# Patient Record
Sex: Male | Born: 1960 | Race: White | Hispanic: No | Marital: Married | Smoking: Current every day smoker
Health system: Southern US, Community
[De-identification: ages and names within clinical notes are randomized; demographics above are authoritative.]

## PROBLEM LIST (undated history)

## (undated) DIAGNOSIS — M79673 Pain in unspecified foot: Secondary | ICD-10-CM

## (undated) DIAGNOSIS — R202 Paresthesia of skin: Secondary | ICD-10-CM

## (undated) DIAGNOSIS — M1990 Unspecified osteoarthritis, unspecified site: Secondary | ICD-10-CM

## (undated) DIAGNOSIS — G5600 Carpal tunnel syndrome, unspecified upper limb: Secondary | ICD-10-CM

## (undated) DIAGNOSIS — R200 Anesthesia of skin: Secondary | ICD-10-CM

## (undated) DIAGNOSIS — E871 Hypo-osmolality and hyponatremia: Secondary | ICD-10-CM

## (undated) DIAGNOSIS — R55 Syncope and collapse: Secondary | ICD-10-CM

## (undated) DIAGNOSIS — D169 Benign neoplasm of bone and articular cartilage, unspecified: Secondary | ICD-10-CM

## (undated) DIAGNOSIS — J069 Acute upper respiratory infection, unspecified: Secondary | ICD-10-CM

## (undated) DIAGNOSIS — I10 Essential (primary) hypertension: Secondary | ICD-10-CM

## (undated) DIAGNOSIS — A4902 Methicillin resistant Staphylococcus aureus infection, unspecified site: Secondary | ICD-10-CM

## (undated) DIAGNOSIS — K644 Residual hemorrhoidal skin tags: Secondary | ICD-10-CM

## (undated) DIAGNOSIS — I4891 Unspecified atrial fibrillation: Secondary | ICD-10-CM

## (undated) HISTORY — DX: Hypo-osmolality and hyponatremia: E87.1

## (undated) HISTORY — DX: Unspecified atrial fibrillation: I48.91

## (undated) HISTORY — DX: Essential (primary) hypertension: I10

## (undated) HISTORY — DX: Syncope and collapse: R55

## (undated) HISTORY — DX: Carpal tunnel syndrome, unspecified upper limb: G56.00

## (undated) HISTORY — PX: ANKLE SURGERY: SHX546

## (undated) HISTORY — DX: Paresthesia of skin: R20.0

## (undated) HISTORY — DX: Benign neoplasm of bone and articular cartilage, unspecified: D16.9

## (undated) HISTORY — PX: KNEE SURGERY: SHX244

## (undated) HISTORY — DX: Acute upper respiratory infection, unspecified: J06.9

## (undated) HISTORY — DX: Residual hemorrhoidal skin tags: K64.4

## (undated) HISTORY — DX: Anesthesia of skin: R20.2

---

## 2017-03-12 DIAGNOSIS — M7671 Peroneal tendinitis, right leg: Secondary | ICD-10-CM | POA: Insufficient documentation

## 2017-03-12 HISTORY — DX: Peroneal tendinitis, right leg: M76.71

## 2018-05-23 DIAGNOSIS — M21621 Bunionette of right foot: Secondary | ICD-10-CM | POA: Insufficient documentation

## 2018-05-23 HISTORY — DX: Bunionette of right foot: M21.621

## 2018-08-13 ENCOUNTER — Encounter: Payer: Self-pay | Admitting: Cardiology

## 2018-08-13 DIAGNOSIS — J069 Acute upper respiratory infection, unspecified: Secondary | ICD-10-CM

## 2018-08-13 DIAGNOSIS — I10 Essential (primary) hypertension: Secondary | ICD-10-CM

## 2018-08-13 DIAGNOSIS — R55 Syncope and collapse: Secondary | ICD-10-CM

## 2018-08-13 HISTORY — DX: Essential (primary) hypertension: I10

## 2018-08-13 HISTORY — DX: Syncope and collapse: R55

## 2018-08-13 HISTORY — DX: Acute upper respiratory infection, unspecified: J06.9

## 2018-08-19 ENCOUNTER — Ambulatory Visit (INDEPENDENT_AMBULATORY_CARE_PROVIDER_SITE_OTHER): Payer: 59 | Admitting: Cardiology

## 2018-08-19 ENCOUNTER — Encounter: Payer: Self-pay | Admitting: Cardiology

## 2018-08-19 DIAGNOSIS — F17210 Nicotine dependence, cigarettes, uncomplicated: Secondary | ICD-10-CM | POA: Diagnosis not present

## 2018-08-19 DIAGNOSIS — I10 Essential (primary) hypertension: Secondary | ICD-10-CM | POA: Diagnosis not present

## 2018-08-19 DIAGNOSIS — R55 Syncope and collapse: Secondary | ICD-10-CM

## 2018-08-19 DIAGNOSIS — R0609 Other forms of dyspnea: Secondary | ICD-10-CM | POA: Insufficient documentation

## 2018-08-19 DIAGNOSIS — J069 Acute upper respiratory infection, unspecified: Secondary | ICD-10-CM | POA: Diagnosis not present

## 2018-08-19 DIAGNOSIS — R0600 Dyspnea, unspecified: Secondary | ICD-10-CM

## 2018-08-19 HISTORY — DX: Other forms of dyspnea: R06.09

## 2018-08-19 HISTORY — DX: Dyspnea, unspecified: R06.00

## 2018-08-19 HISTORY — DX: Nicotine dependence, cigarettes, uncomplicated: F17.210

## 2018-08-19 NOTE — Progress Notes (Signed)
Cardiology Office Note:    Date:  08/19/2018   ID:  Jermaine Rose, DOB 09-Aug-1961, MRN [id number]  PCP:  Darryll Capers, NP  Cardiologist:  Jenean Lindau, MD   Referring MD: Darryll Capers, NP    ASSESSMENT:    1. Syncope, unspecified syncope type   2. Viral upper respiratory tract infection   3. Essential hypertension   4. Cigarette smoker    PLAN:    In order of problems listed above:  1. Primary prevention stressed with the patient.  Importance of compliance with diet and medication stressed and he vocalized understanding.  His blood pressure is stable.  I reviewed lipids from the records provided to me and they were unremarkable. 2. His syncopal event clearly appears to be a vagal event. 3. I spent 5 minutes with the patient discussing solely about smoking. Smoking cessation was counseled. I suggested to the patient also different medications and pharmacological interventions. Patient is keen to try stopping on its own at this time. He will get back to me if he needs any further assistance in this matter. 4. In view of multiple risk factors for coronary artery disease and dyspnea on exertion and sedentary lifestyle he will undergo exercise stress echo. 5. Patient will be seen in follow-up appointment in 6 months or earlier if the patient has any concerns    Medication Adjustments/Labs and Tests Ordered: Current medicines are reviewed at length with the patient today.  Concerns regarding medicines are outlined above.  No orders of the defined types were placed in this encounter.  No orders of the defined types were placed in this encounter.    History of Present Illness:    Jermaine Rose is a 57 y.o. male who is being seen today for the evaluation of syncope at the request of Rhyne, Nyoka Cowden, NP.  Patient is a pleasant 57 year old male.  He has past medical history of essential hypertension.  He leads a sedentary lifestyle because of orthopedic issues relating to his right  foot.  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  He mentions to me that he got bad news suddenly and then he passed out.  He is never had any chest pain orthopnea or PND.  He has some shortness of breath on exertion.  He is unfortunately his longtime cigarette smoker.  No palpitations or any such symptoms.  He has never had such an event in the past.  At the time of my evaluation, the patient is alert awake oriented and in no distress.  Past Medical History:  Diagnosis Date  . External hemorrhoids   . Hypertension 08/13/2018  . Syncope 08/13/2018  . URI (upper respiratory infection) 08/13/2018    Past Surgical History:  Procedure Laterality Date  . KNEE SURGERY      Current Medications: Current Meds  Medication Sig  . amLODipine (NORVASC) 10 MG tablet Take 10 mg by mouth daily.  Marland Kitchen Dextromethorphan-guaiFENesin (MUCINEX DM MAXIMUM STRENGTH) 60-1200 MG TB12 Take 1 tablet by mouth every 12 (twelve) hours.  . psyllium (METAMUCIL) 58.6 % packet Take 1 packet by mouth daily.     Allergies:   Lisinopril   Social History   Socioeconomic History  . Marital status: Unknown    Spouse name: Not on file  . Number of children: Not on file  . Years of education: Not on file  . Highest education level: Not on file  Occupational History  . Not on file  Social Needs  .  Financial resource strain: Not on file  . Food insecurity:    Worry: Not on file    Inability: Not on file  . Transportation needs:    Medical: Not on file    Non-medical: Not on file  Tobacco Use  . Smoking status: Current Every Day Smoker  . Smokeless tobacco: Never Used  Substance and Sexual Activity  . Alcohol use: Not on file  . Drug use: Not on file  . Sexual activity: Not on file  Lifestyle  . Physical activity:    Days per week: Not on file    Minutes per session: Not on file  . Stress: Not on file  Relationships  . Social connections:    Talks on phone: Not on file    Gets together: Not on file    Attends  religious service: Not on file    Active member of club or organization: Not on file    Attends meetings of clubs or organizations: Not on file    Relationship status: Not on file  Other Topics Concern  . Not on file  Social History Narrative  . Not on file     Family History: The patient's family history includes Breast cancer in his mother.  ROS:   Please see the history of present illness.    All other systems reviewed and are negative.  EKGs/Labs/Other Studies Reviewed:    The following studies were reviewed today: EKG reveals sinus rhythm and nonspecific ST changes.   Recent Labs: No results found for requested labs within last 8760 hours.  Recent Lipid Panel No results found for: CHOL, TRIG, HDL, CHOLHDL, VLDL, LDLCALC, LDLDIRECT  Physical Exam:    VS:  BP (!) 124/58 (BP Location: Right Arm, Patient Position: Sitting, Cuff Size: Normal)   Pulse 86   Ht 6\' 1"  (1.854 m)   Wt 194 lb (88 kg)   SpO2 98%   BMI 25.60 kg/m     Wt Readings from Last 3 Encounters:  08/19/18 194 lb (88 kg)     GEN: Patient is in no acute distress HEENT: Normal NECK: No JVD; No carotid bruits LYMPHATICS: No lymphadenopathy CARDIAC: S1 S2 regular, 2/6 systolic murmur at the apex. RESPIRATORY:  Clear to auscultation without rales, wheezing or rhonchi  ABDOMEN: Soft, non-tender, non-distended MUSCULOSKELETAL:  No edema; No deformity  SKIN: Warm and dry NEUROLOGIC:  Alert and oriented x 3 PSYCHIATRIC:  Normal affect    Signed, Jenean Lindau, MD  08/19/2018 4:06 PM    [facility]

## 2018-08-19 NOTE — Patient Instructions (Signed)
Medication Instructions:  Your physician recommends that you continue on your current medications as directed. Please refer to the Current Medication list given to you today.  If you need a refill on your cardiac medications before your next appointment, please call your pharmacy.   Lab work: None If you have labs (blood work) drawn today and your tests are completely normal, you will receive your results only by: Marland Kitchen MyChart Message (if you have MyChart) OR . A paper copy in the mail If you have any lab test that is abnormal or we need to change your treatment, we will call you to review the results.  Testing/Procedures: Your physician has requested that you have a stress echocardiogram. For further information please visit [email or web address]. Please follow instruction sheet as given.  Follow-Up: At [facility], you and your health needs are our priority.  As part of our continuing mission to provide you with exceptional heart care, we have created designated Provider Care Teams.  These Care Teams include your primary Cardiologist (physician) and Advanced Practice Providers (APPs -  Physician Assistants and Nurse Practitioners) who all work together to provide you with the care you need, when you need it.  You will need a follow up appointment in 6 months.  Please call our office 2 months in advance to schedule this appointment.  You may see another member of our [employer] Provider Team in [place]: Jenne Campus, MD . Shirlee More, MD  Any Other Special Instructions Will Be Listed Below (If Applicable).

## 2018-08-19 NOTE — Addendum Note (Signed)
Addended by: Mattie Marlin on: 08/19/2018 04:12 PM   Modules accepted: Orders

## 2018-08-25 ENCOUNTER — Ambulatory Visit (INDEPENDENT_AMBULATORY_CARE_PROVIDER_SITE_OTHER): Payer: 59

## 2018-08-25 DIAGNOSIS — R55 Syncope and collapse: Secondary | ICD-10-CM | POA: Diagnosis not present

## 2018-08-25 DIAGNOSIS — R0609 Other forms of dyspnea: Secondary | ICD-10-CM | POA: Diagnosis not present

## 2018-08-25 NOTE — Progress Notes (Signed)
Stress echocardiogram with limited exam has been performed.  Jimmy Hashir Deleeuw RDCS, RVT

## 2019-12-30 ENCOUNTER — Encounter: Payer: Self-pay | Admitting: *Deleted

## 2020-01-04 ENCOUNTER — Ambulatory Visit: Payer: 59 | Admitting: Diagnostic Neuroimaging

## 2020-01-04 ENCOUNTER — Encounter: Payer: Self-pay | Admitting: Diagnostic Neuroimaging

## 2020-01-04 ENCOUNTER — Other Ambulatory Visit: Payer: Self-pay

## 2020-01-04 VITALS — BP 158/89 | HR 101 | Temp 97.5°F | Ht 73.0 in | Wt 190.0 lb

## 2020-01-04 DIAGNOSIS — R200 Anesthesia of skin: Secondary | ICD-10-CM

## 2020-01-04 NOTE — Patient Instructions (Signed)
Bilateral hand numbness (suspect bilateral carpal tunnel syndrome) - check NCV/EMG (nerve testing) - try wrist splints at bedtime

## 2020-01-04 NOTE — Progress Notes (Signed)
[facility]  PATIENT: Jermaine Rose DOB: [birth year]  REFERRING CLINICIAN: Elenore Paddy, NP HISTORY FROM: patient  REASON FOR VISIT: new consult    HISTORICAL  CHIEF COMPLAINT:  Chief Complaint  Patient presents with  . R Hand Paresthesia    rm 7 New Pt "numbness, tingling both arms from elbow down, very painful, wakes me at night for past year; PCP checked labs- all okay"    HISTORY OF PRESENT ILLNESS:   59 year old male with bilateral hand numbness for past 1 year.  Symptoms mainly affect him at nighttime.  He has had interrupted sleep because of this problem.  He feels numbness and pain in his hands sometimes from his elbows down to his fingers.  Fifth digit is less affected than others.  No problems in legs or feet.  No problems in face vision speech or swallowing.  No neck pain.  Lab testing by PCP has been unremarkable.   REVIEW OF SYSTEMS: Full 14 system review of systems performed and negative with exception of: As per HPI.  ALLERGIES: Allergies  Allergen Reactions  . Lisinopril     Ace inhibitors- Swelling; Angio-Edema    HOME MEDICATIONS: Outpatient Medications Prior to Visit  Medication Sig Dispense Refill  . amLODipine (NORVASC) 10 MG tablet Take 10 mg by mouth daily.    . hydrochlorothiazide (HYDRODIURIL) 12.5 MG tablet Take 12.5 mg by mouth every morning.    . psyllium (METAMUCIL) 58.6 % packet Take 1 packet by mouth daily.    Marland Kitchen Dextromethorphan-guaiFENesin (MUCINEX DM MAXIMUM STRENGTH) 60-1200 MG TB12 Take 1 tablet by mouth every 12 (twelve) hours.     No facility-administered medications prior to visit.    PAST MEDICAL HISTORY: Past Medical History:  Diagnosis Date  . External hemorrhoids   . Hypertension 08/13/2018  . Numbness and tingling in both hands   . Syncope 08/13/2018  . URI (upper respiratory infection) 08/13/2018    PAST SURGICAL HISTORY: Past Surgical History:  Procedure Laterality Date  . ANKLE SURGERY Right  2008,2009  . KNEE SURGERY Bilateral     FAMILY HISTORY: Family History  Problem Relation Age of Onset  . Breast cancer Mother   . Atrial fibrillation Mother   . Atrial fibrillation Father     SOCIAL HISTORY: Social History   Socioeconomic History  . Marital status: Married    Spouse name: Not on file  . Number of children: 1  . Years of education: Not on file  . Highest education level: High school graduate  Occupational History    Comment: [employer]  Tobacco Use  . Smoking status: Current Every Day Smoker    Packs/day: 0.75    Types: Cigarettes  . Smokeless tobacco: Never Used  Substance and Sexual Activity  . Alcohol use: Yes    Alcohol/week: 14.0 standard drinks    Types: 14 Cans of beer per week  . Drug use: Not Currently  . Sexual activity: Not on file  Other Topics Concern  . Not on file  Social History Narrative   Lives with wife   caffeine use heavy   Social Determinants of Health   Financial Resource Strain:   . Difficulty of Paying Living Expenses:   Food Insecurity:   . Worried About Charity fundraiser in the Last Year:   . Arboriculturist in the Last Year:   Transportation Needs:   . Film/video editor (Medical):   Marland Kitchen Lack of Transportation (Non-Medical):   Physical Activity:   .  Days of Exercise per Week:   . Minutes of Exercise per Session:   Stress:   . Feeling of Stress :   Social Connections:   . Frequency of Communication with Friends and Family:   . Frequency of Social Gatherings with Friends and Family:   . Attends Religious Services:   . Active Member of Clubs or Organizations:   . Attends Archivist Meetings:   Marland Kitchen Marital Status:   Intimate Partner Violence:   . Fear of Current or Ex-Partner:   . Emotionally Abused:   Marland Kitchen Physically Abused:   . Sexually Abused:      PHYSICAL EXAM  GENERAL EXAM/CONSTITUTIONAL: Vitals:  Vitals:   01/04/20 0806  BP: (!) 158/89  Pulse: (!) 101  Temp: (!) 97.5 F (36.4 C)  Weight:  190 lb 0.6 oz (86.2 kg)  Height: 6\' 1"  (1.854 m)     Body mass index is 25.07 kg/m. Wt Readings from Last 3 Encounters:  01/04/20 190 lb 0.6 oz (86.2 kg)  08/19/18 194 lb (88 kg)     Patient is in no distress; well developed, nourished and groomed; neck is supple  CARDIOVASCULAR:  Examination of carotid arteries is normal; no carotid bruits  Regular rate and rhythm, no murmurs  Examination of peripheral vascular system by observation and palpation is normal  EYES:  Ophthalmoscopic exam of optic discs and posterior segments is normal; no papilledema or hemorrhages  No exam data present  MUSCULOSKELETAL:  Gait, strength, tone, movements noted in Neurologic exam below  NEUROLOGIC: MENTAL STATUS:  No flowsheet data found.  awake, alert, oriented to person, place and time  recent and remote memory intact  normal attention and concentration  language fluent, comprehension intact, naming intact  fund of knowledge appropriate  CRANIAL NERVE:   2nd - no papilledema on fundoscopic exam  2nd, 3rd, 4th, 6th - pupils equal and reactive to light, visual fields full to confrontation, extraocular muscles intact, no nystagmus  5th - facial sensation symmetric  7th - facial strength symmetric  8th - hearing intact  9th - palate elevates symmetrically, uvula midline  11th - shoulder shrug symmetric  12th - tongue protrusion midline  MOTOR:   normal bulk and tone, full strength in the BUE, BLE; EXCEPT ATROPHY OF RIGHT > LEFT APB  SENSORY:   normal and symmetric to light touch, pinprick, temperature, vibration; EXCEPT DECR IN FINGERTIPS  NEGATIVE TINELS  BORDERLINE PHALENS  COORDINATION:   finger-nose-finger, fine finger movements normal  REFLEXES:   deep tendon reflexes present and symmetric  GAIT/STATION:   narrow based gait     DIAGNOSTIC DATA (LABS, IMAGING, TESTING) - I reviewed patient records, labs, notes, testing and imaging myself where  available.  No results found for: WBC, HGB, HCT, MCV, PLT No results found for: NA, K, CL, CO2, GLUCOSE, BUN, CREATININE, CALCIUM, PROT, ALBUMIN, AST, ALT, ALKPHOS, BILITOT, GFRNONAA, GFRAA No results found for: CHOL, HDL, LDLCALC, LDLDIRECT, TRIG, CHOLHDL No results found for: HGBA1C No results found for: VITAMINB12 No results found for: TSH   A1c, B12 - normal    ASSESSMENT AND PLAN  59 y.o. year old male here with bilateral hand numbness since 2020 with signs and symptoms most consistent with bilateral carpal tunnel syndrome.  We will proceed with further work-up.  Dx:  1. Bilateral hand numbness      PLAN:  Bilateral hand numbness (suspect bilateral CTS) - check NCV/EMG - try wrist splints at bedtime  Orders Placed This Encounter  Procedures  . NCV with EMG(electromyography)   Return for for NCV/EMG.    Penni Bombard, MD [id number], [id number] AM Certified in Neurology, Neurophysiology and Neuroimaging  [facility] [address] [place], [place] [address] [phone]

## 2020-02-04 ENCOUNTER — Encounter: Payer: 59 | Admitting: Diagnostic Neuroimaging

## 2020-02-04 ENCOUNTER — Ambulatory Visit (INDEPENDENT_AMBULATORY_CARE_PROVIDER_SITE_OTHER): Payer: 59 | Admitting: Diagnostic Neuroimaging

## 2020-02-04 ENCOUNTER — Other Ambulatory Visit: Payer: Self-pay

## 2020-02-04 DIAGNOSIS — R200 Anesthesia of skin: Secondary | ICD-10-CM | POA: Diagnosis not present

## 2020-02-04 DIAGNOSIS — Z0289 Encounter for other administrative examinations: Secondary | ICD-10-CM

## 2020-02-04 NOTE — Procedures (Signed)
[facility]  NCS (NERVE CONDUCTION STUDY) WITH EMG (ELECTROMYOGRAPHY) REPORT   STUDY DATE: 02/04/20 PATIENT NAME: Jermaine Rose DOB: 1961/01/09 MRN: [id number]  ORDERING CLINICIAN: Andrey Spearman, MD   TECHNOLOGIST: Sherre Scarlet ELECTROMYOGRAPHER: Earlean Polka. Lizania Bouchard, MD  CLINICAL INFORMATION: 59 year old male with bilateral hand numbness.  FINDINGS: NERVE CONDUCTION STUDY:  Right median motor response has prolonged distal latency (6.8 ms) decreased amplitude and normal conduction velocity.  Left median motor response has prolonged distal latency (7.2 ms) normal amplitude, slow conduction velocity.  Bilateral ulnar motor responses are normal.  Bilateral ulnar F-wave latencies are slightly prolonged.  Bilateral median sensory responses could not be obtained.  Bilateral ulnar sensory responses have slightly prolonged peak latencies and normal amplitudes.  Bilateral radial sensory responses are normal.    NEEDLE ELECTROMYOGRAPHY:  Needle examination of left upper extremity deltoid, biceps, triceps, flexor carpi radialis, first dorsal interosseous is normal.    IMPRESSION:   Abnormal study demonstrating: - Moderate bilateral median neuropathies at the wrist consistent with moderate bilateral carpal tunnel syndrome (right worse than left).  - Borderline, mild bilateral ulnar sensory neuropathies, not further localized.    INTERPRETING PHYSICIAN:  Penni Bombard, MD Certified in Neurology, Neurophysiology and Neuroimaging  [facility] [address] [place], [place] [address] [phone]  [facility]    Nerve / Sites Muscle Latency Ref. Amplitude Ref. Rel Amp Segments Distance Velocity Ref. Area    ms ms mV mV %  cm m/s m/s mVms  R Median - APB     Wrist APB 6.8 ?4.4 0.1 ?4.0 100 Wrist - APB 7   0.6     Upper arm APB 11.0  0.1  54.3 Upper arm - Wrist 23 55 ?49 1.0  L Median - APB     Wrist APB 7.2 ?4.4 5.7 ?4.0 100  Wrist - APB 7   21.3     Upper arm APB 13.2  5.3  92.7 Upper arm - Wrist 23 38 ?49 19.5  R Ulnar - ADM     Wrist ADM 2.8 ?3.3 11.5 ?6.0 100 Wrist - ADM 7   34.3     B.Elbow ADM 7.3  11.2  97.3 B.Elbow - Wrist 24 53 ?49 31.8     A.Elbow ADM 9.3  11.1  98.6 A.Elbow - B.Elbow 10 50 ?49 31.5         A.Elbow - Wrist      L Ulnar - ADM     Wrist ADM 2.8 ?3.3 11.5 ?6.0 100 Wrist - ADM 7   33.4     B.Elbow ADM 7.1  10.0  86.8 B.Elbow - Wrist 23 53 ?49 30.4     A.Elbow ADM 9.1  9.7  97.4 A.Elbow - B.Elbow 10 49 ?49 30.3         A.Elbow - Wrist                 SNC    Nerve / Sites Rec. Site Peak Lat Ref.  Amp Ref. Segments Distance    ms ms V V  cm  R Radial - Anatomical snuff box (Forearm)     Forearm Wrist 2.5 ?2.9 18 ?15 Forearm - Wrist 10  L Radial - Anatomical snuff box (Forearm)     Forearm Wrist 2.6 ?2.9 16 ?15 Forearm - Wrist 10  R Median - Orthodromic (Dig II, Mid palm)     Dig II Wrist NR ?3.4 NR ?10 Dig II -  Wrist 13  L Median - Orthodromic (Dig II, Mid palm)     Dig II Wrist NR ?3.4 NR ?10 Dig II - Wrist 13  R Ulnar - Orthodromic, (Dig V, Mid palm)     Dig V Wrist 3.2 ?3.1 6 ?5 Dig V - Wrist 11  L Ulnar - Orthodromic, (Dig V, Mid palm)     Dig V Wrist 3.4 ?3.1 6 ?5 Dig V - Wrist 8                 F  Wave    Nerve F Lat Ref.   ms ms  R Ulnar - ADM 34.0 ?32.0  L Ulnar - ADM 35.1 ?32.0         EMG Summary Table    Spontaneous MUAP Recruitment  Muscle IA Fib PSW Fasc Other Amp Dur. Poly Pattern  L. Deltoid Normal None None None _______ Normal Normal Normal Normal  L. Biceps brachii Normal None None None _______ Normal Normal Normal Normal  L. Triceps brachii Normal None None None _______ Normal Normal Normal Normal  L. Flexor carpi radialis Normal None None None _______ Normal Normal Normal Normal  L. First dorsal interosseous Normal None None None _______ Normal Normal Normal Normal

## 2020-02-15 DIAGNOSIS — M180 Bilateral primary osteoarthritis of first carpometacarpal joints: Secondary | ICD-10-CM

## 2020-02-15 HISTORY — DX: Bilateral primary osteoarthritis of first carpometacarpal joints: M18.0

## 2020-02-18 ENCOUNTER — Other Ambulatory Visit: Payer: Self-pay | Admitting: Orthopedic Surgery

## 2020-04-25 ENCOUNTER — Other Ambulatory Visit: Payer: Self-pay

## 2020-04-25 ENCOUNTER — Encounter (HOSPITAL_BASED_OUTPATIENT_CLINIC_OR_DEPARTMENT_OTHER): Payer: Self-pay | Admitting: Orthopedic Surgery

## 2020-04-28 ENCOUNTER — Other Ambulatory Visit (HOSPITAL_COMMUNITY): Payer: 59 | Attending: Orthopedic Surgery

## 2020-04-30 ENCOUNTER — Other Ambulatory Visit (HOSPITAL_COMMUNITY)
Admission: RE | Admit: 2020-04-30 | Discharge: 2020-04-30 | Disposition: A | Discharge status: Home / Self Care | Payer: 59 | Source: Ambulatory Visit | Attending: Orthopedic Surgery | Admitting: Orthopedic Surgery

## 2020-04-30 DIAGNOSIS — Z20822 Contact with and (suspected) exposure to covid-19: Secondary | ICD-10-CM | POA: Insufficient documentation

## 2020-04-30 LAB — SARS CORONAVIRUS 2 (TAT 6-24 HRS): SARS Coronavirus 2: NEGATIVE

## 2020-05-02 ENCOUNTER — Ambulatory Visit (HOSPITAL_BASED_OUTPATIENT_CLINIC_OR_DEPARTMENT_OTHER): Payer: 59 | Admitting: Anesthesiology

## 2020-05-02 ENCOUNTER — Encounter (HOSPITAL_BASED_OUTPATIENT_CLINIC_OR_DEPARTMENT_OTHER): Payer: Self-pay | Admitting: Orthopedic Surgery

## 2020-05-02 ENCOUNTER — Ambulatory Visit (HOSPITAL_BASED_OUTPATIENT_CLINIC_OR_DEPARTMENT_OTHER)
Admission: RE | Admit: 2020-05-02 | Discharge: 2020-05-02 | Disposition: A | Discharge status: Home / Self Care | Payer: 59 | Attending: Orthopedic Surgery | Admitting: Orthopedic Surgery

## 2020-05-02 ENCOUNTER — Encounter (HOSPITAL_BASED_OUTPATIENT_CLINIC_OR_DEPARTMENT_OTHER)
Admission: RE | Disposition: A | Discharge status: Home / Self Care | Payer: Self-pay | Source: Home / Self Care | Attending: Orthopedic Surgery

## 2020-05-02 DIAGNOSIS — Z888 Allergy status to other drugs, medicaments and biological substances status: Secondary | ICD-10-CM | POA: Diagnosis not present

## 2020-05-02 DIAGNOSIS — G5601 Carpal tunnel syndrome, right upper limb: Secondary | ICD-10-CM | POA: Diagnosis not present

## 2020-05-02 DIAGNOSIS — I10 Essential (primary) hypertension: Secondary | ICD-10-CM | POA: Diagnosis not present

## 2020-05-02 DIAGNOSIS — M1990 Unspecified osteoarthritis, unspecified site: Secondary | ICD-10-CM | POA: Diagnosis not present

## 2020-05-02 DIAGNOSIS — Z79899 Other long term (current) drug therapy: Secondary | ICD-10-CM | POA: Insufficient documentation

## 2020-05-02 DIAGNOSIS — F17210 Nicotine dependence, cigarettes, uncomplicated: Secondary | ICD-10-CM | POA: Diagnosis not present

## 2020-05-02 DIAGNOSIS — Z8249 Family history of ischemic heart disease and other diseases of the circulatory system: Secondary | ICD-10-CM | POA: Diagnosis not present

## 2020-05-02 HISTORY — DX: Unspecified osteoarthritis, unspecified site: M19.90

## 2020-05-02 HISTORY — PX: CARPAL TUNNEL RELEASE: SHX101

## 2020-05-02 HISTORY — DX: Pain in unspecified foot: M79.673

## 2020-05-02 HISTORY — DX: Methicillin resistant Staphylococcus aureus infection, unspecified site: A49.02

## 2020-05-02 SURGERY — CARPAL TUNNEL RELEASE
Anesthesia: Regional | Site: Wrist | Laterality: Right

## 2020-05-02 MED ORDER — ONDANSETRON HCL 4 MG/2ML IJ SOLN
INTRAMUSCULAR | Status: DC | PRN
  Administered 2020-05-02: 4 mg via INTRAVENOUS

## 2020-05-02 MED ORDER — CEFAZOLIN SODIUM-DEXTROSE 2-4 GM/100ML-% IV SOLN
INTRAVENOUS | Status: AC
  Filled 2020-05-02: qty 100

## 2020-05-02 MED ORDER — MIDAZOLAM HCL 5 MG/5ML IJ SOLN
INTRAMUSCULAR | Status: DC | PRN
  Administered 2020-05-02: 2 mg via INTRAVENOUS

## 2020-05-02 MED ORDER — MEPERIDINE HCL 25 MG/ML IJ SOLN: 6.2500 mg | INTRAMUSCULAR | Status: DC | PRN

## 2020-05-02 MED ORDER — FENTANYL CITRATE (PF) 100 MCG/2ML IJ SOLN: 25.0000 ug | INTRAMUSCULAR | Status: DC | PRN

## 2020-05-02 MED ORDER — PROMETHAZINE HCL 25 MG/ML IJ SOLN: 6.2500 mg | INTRAMUSCULAR | Status: DC | PRN

## 2020-05-02 MED ORDER — LIDOCAINE HCL (PF) 0.5 % IJ SOLN
INTRAMUSCULAR | Status: DC | PRN
Start: 2020-05-02 — End: 2020-05-02
  Administered 2020-05-02: 35 mL via INTRAVENOUS

## 2020-05-02 MED ORDER — PROPOFOL 10 MG/ML IV BOLUS
INTRAVENOUS | Status: DC | PRN
Start: 2020-05-02 — End: 2020-05-02
  Administered 2020-05-02 (×5): 20 mg via INTRAVENOUS

## 2020-05-02 MED ORDER — OXYCODONE HCL 5 MG/5ML PO SOLN: 5.0000 mg | Freq: Once | ORAL | Status: DC | PRN

## 2020-05-02 MED ORDER — HYDROCODONE-ACETAMINOPHEN 5-325 MG PO TABS: ORAL_TABLET | ORAL | 0 refills | Status: DC

## 2020-05-02 MED ORDER — FENTANYL CITRATE (PF) 100 MCG/2ML IJ SOLN
INTRAMUSCULAR | Status: DC | PRN
  Administered 2020-05-02: 50 ug via INTRAVENOUS

## 2020-05-02 MED ORDER — LACTATED RINGERS IV SOLN: INTRAVENOUS | Status: DC

## 2020-05-02 MED ORDER — PROPOFOL 500 MG/50ML IV EMUL
INTRAVENOUS | Status: DC | PRN
  Administered 2020-05-02: 100 ug/kg/min via INTRAVENOUS

## 2020-05-02 MED ORDER — CEFAZOLIN SODIUM-DEXTROSE 2-4 GM/100ML-% IV SOLN
2.0000 g | INTRAVENOUS | Status: AC
  Administered 2020-05-02: 2 g via INTRAVENOUS

## 2020-05-02 MED ORDER — OXYCODONE HCL 5 MG PO TABS: 5.0000 mg | ORAL_TABLET | Freq: Once | ORAL | Status: DC | PRN

## 2020-05-02 MED ORDER — LIDOCAINE HCL (CARDIAC) PF 100 MG/5ML IV SOSY
PREFILLED_SYRINGE | INTRAVENOUS | Status: DC | PRN
  Administered 2020-05-02: 40 mg via INTRAVENOUS

## 2020-05-02 MED ORDER — BUPIVACAINE HCL (PF) 0.25 % IJ SOLN
INTRAMUSCULAR | Status: DC | PRN
  Administered 2020-05-02: 9 mL

## 2020-05-02 SURGICAL SUPPLY — 38 items
APL PRP STRL LF DISP 70% ISPRP (MISCELLANEOUS) ×1
BLADE SURG 15 STRL LF DISP TIS (BLADE) ×2 IMPLANT
BLADE SURG 15 STRL SS (BLADE) ×6
BNDG CMPR 9X4 STRL LF SNTH (GAUZE/BANDAGES/DRESSINGS)
BNDG ELASTIC 3X5.8 VLCR STR LF (GAUZE/BANDAGES/DRESSINGS) ×3 IMPLANT
BNDG ESMARK 4X9 LF (GAUZE/BANDAGES/DRESSINGS) IMPLANT
BNDG GAUZE ELAST 4 BULKY (GAUZE/BANDAGES/DRESSINGS) ×3 IMPLANT
CHLORAPREP W/TINT 26 (MISCELLANEOUS) ×3 IMPLANT
CORD BIPOLAR FORCEPS 12FT (ELECTRODE) ×3 IMPLANT
COVER BACK TABLE 60X90IN (DRAPES) ×3 IMPLANT
COVER MAYO STAND STRL (DRAPES) ×3 IMPLANT
COVER WAND RF STERILE (DRAPES) IMPLANT
CUFF TOURN SGL QUICK 18X4 (TOURNIQUET CUFF) ×3 IMPLANT
DRAPE EXTREMITY T 121X128X90 (DISPOSABLE) ×3 IMPLANT
DRAPE SURG 17X23 STRL (DRAPES) ×3 IMPLANT
DRSG PAD ABDOMINAL 8X10 ST (GAUZE/BANDAGES/DRESSINGS) ×3 IMPLANT
GAUZE SPONGE 4X4 12PLY STRL (GAUZE/BANDAGES/DRESSINGS) ×3 IMPLANT
GAUZE XEROFORM 1X8 LF (GAUZE/BANDAGES/DRESSINGS) ×3 IMPLANT
GLOVE BIO SURGEON STRL SZ7.5 (GLOVE) ×3 IMPLANT
GLOVE BIOGEL PI IND STRL 7.0 (GLOVE) ×2 IMPLANT
GLOVE BIOGEL PI IND STRL 8 (GLOVE) ×1 IMPLANT
GLOVE BIOGEL PI INDICATOR 7.0 (GLOVE) ×4
GLOVE BIOGEL PI INDICATOR 8 (GLOVE) ×2
GLOVE ECLIPSE 6.5 STRL STRAW (GLOVE) ×3 IMPLANT
GOWN STRL REUS W/ TWL LRG LVL3 (GOWN DISPOSABLE) ×1 IMPLANT
GOWN STRL REUS W/TWL LRG LVL3 (GOWN DISPOSABLE) ×3
GOWN STRL REUS W/TWL XL LVL3 (GOWN DISPOSABLE) ×3 IMPLANT
NEEDLE HYPO 25X1 1.5 SAFETY (NEEDLE) ×3 IMPLANT
NS IRRIG 1000ML POUR BTL (IV SOLUTION) ×3 IMPLANT
PACK BASIN DAY SURGERY FS (CUSTOM PROCEDURE TRAY) ×3 IMPLANT
PADDING CAST ABS 4INX4YD NS (CAST SUPPLIES) ×2
PADDING CAST ABS COTTON 4X4 ST (CAST SUPPLIES) ×1 IMPLANT
STOCKINETTE 4X48 STRL (DRAPES) ×3 IMPLANT
SUT ETHILON 4 0 PS 2 18 (SUTURE) ×3 IMPLANT
SYR BULB EAR ULCER 3OZ GRN STR (SYRINGE) ×3 IMPLANT
SYR CONTROL 10ML LL (SYRINGE) ×3 IMPLANT
TOWEL GREEN STERILE FF (TOWEL DISPOSABLE) ×6 IMPLANT
UNDERPAD 30X36 HEAVY ABSORB (UNDERPADS AND DIAPERS) ×3 IMPLANT

## 2020-05-02 NOTE — Anesthesia Postprocedure Evaluation (Signed)
Anesthesia Post Note  Patient: Jermaine Rose  Procedure(s) Performed: CARPAL TUNNEL RELEASE (Right Wrist)     Patient location during evaluation: PACU Anesthesia Type: Bier Block Level of consciousness: awake and alert Pain management: pain level controlled Vital Signs Assessment: post-procedure vital signs reviewed and stable Respiratory status: spontaneous breathing Cardiovascular status: stable Anesthetic complications: no   No complications documented.  Last Vitals:  Vitals:   05/02/20 1430 05/02/20 1442  BP: 126/88 (!) 137/54  Pulse: (!) 51 (!) 54  Resp: 15 16  Temp:  36.4 C  SpO2: 100% 99%    Last Pain:  Vitals:   05/02/20 1442  TempSrc:   PainSc: 0-No pain                 Nolon Nations

## 2020-05-02 NOTE — Transfer of Care (Signed)
Immediate Anesthesia Transfer of Care Note  Patient: Jermaine Rose  Procedure(s) Performed: CARPAL TUNNEL RELEASE (Right Wrist)  Patient Location: PACU  Anesthesia Type:MAC and Bier block  Level of Consciousness: drowsy, patient cooperative and responds to stimulation  Airway & Oxygen Therapy: Patient Spontanous Breathing and Patient connected to face mask oxygen  Post-op Assessment: Report given to RN and Post -op Vital signs reviewed and stable  Post vital signs: Reviewed and stable  Last Vitals:  Vitals Value Taken Time  BP    Temp    Pulse    Resp 14 05/02/20 1402  SpO2    Vitals shown include unvalidated device data.  Last Pain:  Vitals:   05/02/20 1105  TempSrc: Oral  PainSc: 0-No pain         Complications: No complications documented.

## 2020-05-02 NOTE — Anesthesia Procedure Notes (Signed)
Anesthesia Regional Block: Bier block (IV Regional)   Pre-Anesthetic Checklist: ,, timeout performed, Correct Patient, Correct Site, Correct Laterality, Correct Procedure, Correct Position, site marked, Risks and benefits discussed, Surgical consent,  Pre-op evaluation,  At surgeon's request  Laterality: Right  Prep: alcohol swabs       Needles:  Injection technique: Single-shot      Additional Needles:   Procedures:,,,,, intact distal pulses, Esmarch exsanguination,, #20gu IV placed  Narrative:  Start time: 05/02/2020 1:27 PM End time: 05/02/2020 1:28 PM  Performed by: Personally

## 2020-05-02 NOTE — H&P (Signed)
Jermaine Rose is an 58 y.o. male.   Chief Complaint: right carpal tunnel syndrome HPI: 59 yo male with bilateral hand numbness and tingling.  Nocturnal symptoms.  Positive nerve conduction studies.  He wishes to have a carpal tunnel release.  Allergies:  Allergies  Allergen Reactions  . Lisinopril     Ace inhibitors- Swelling; Angio-Edema    Past Medical History:  Diagnosis Date  . Arthritis   . External hemorrhoids   . Foot pain   . Hypertension 08/13/2018  . MRSA infection    6-8 years ago in his neck per patient  . Numbness and tingling in both hands   . Syncope 08/13/2018   had Cath at [place] but no follow up needed per patient  . URI (upper respiratory infection) 08/13/2018    Past Surgical History:  Procedure Laterality Date  . ANKLE SURGERY Right 2008,2009  . KNEE SURGERY Bilateral     Family History: Family History  Problem Relation Age of Onset  . Breast cancer Mother   . Atrial fibrillation Mother   . Atrial fibrillation Father     Social History:   reports that he has been smoking cigarettes. He has been smoking about 0.75 packs per day. He has never used smokeless tobacco. He reports current alcohol use of about 20.0 standard drinks of alcohol per week. He reports previous drug use.  Medications: Medications Prior to Admission  Medication Sig Dispense Refill  . amLODipine (NORVASC) 10 MG tablet Take 10 mg by mouth daily.    . metoprolol tartrate (LOPRESSOR) 50 MG tablet Take 50 mg by mouth 2 (two) times daily.    Marland Kitchen Dextromethorphan-guaiFENesin (MUCINEX DM MAXIMUM STRENGTH) 60-1200 MG TB12 Take 1 tablet by mouth every 12 (twelve) hours.    . hydrochlorothiazide (HYDRODIURIL) 12.5 MG tablet Take 12.5 mg by mouth every morning.    . psyllium (METAMUCIL) 58.6 % packet Take 1 packet by mouth daily.      No results found for this or any previous visit (from the past 48 hour(s)).  No results found.   A comprehensive review of systems was  negative.  Blood pressure (!) 150/80, pulse 57, temperature 98.2 F (36.8 C), temperature source Oral, resp. rate 18, height 6\' 1"  (1.854 m), weight 84 kg, SpO2 100 %.  General appearance: alert, cooperative and appears stated age Head: Normocephalic, without obvious abnormality, atraumatic Neck: supple, symmetrical, trachea midline Cardio: regular rate and rhythm Resp: clear to auscultation bilaterally Extremities: Intact sensation and capillary refill all digits though fingertips feel different.  +epl/fpl/io.  No wounds.  Pulses: 2+ and symmetric Skin: Skin color, texture, turgor normal. No rashes or lesions Neurologic: Grossly normal Incision/Wound: none  Assessment/Plan Right carpal tunnel syndrome.  Non operative and operative treatment options have been discussed with the patient and patient wishes to proceed with operative treatment. Risks, benefits, and alternatives of surgery have been discussed and the patient agrees with the plan of care.   Leanora Cover 05/02/2020, 12:57 PM

## 2020-05-02 NOTE — Discharge Instructions (Addendum)
[facility] Instructions Hand Surgery  Wound Care: Keep your hand elevated above the level of your heart.  Do not allow it to dangle by your side.  Keep the dressing dry and do not remove it unless your doctor advises you to do so.  He will usually change it at the time of your post-op visit.  Moving your fingers is advised to stimulate circulation but will depend on the site of your surgery.  If you have a splint applied, your doctor will advise you regarding movement.  Activity: Do not drive or operate machinery today.  Rest today and then you may return to your normal activity and work as indicated by your physician.  Diet:  Drink liquids today or eat a light diet.  You may resume a regular diet tomorrow.    General expectations: Pain for two to three days. Fingers may become slightly swollen.  Call your doctor if any of the following occur: Severe pain not relieved by pain medication. Elevated temperature. Dressing soaked with blood. Inability to move fingers. White or bluish color to fingers.   Post Anesthesia Home Care Instructions  Activity: Get plenty of rest for the remainder of the day. A responsible individual must stay with you for 24 hours following the procedure.  For the next 24 hours, DO NOT: -Drive a car -Operate machinery -Drink alcoholic beverages -Take any medication unless instructed by your physician -Make any legal decisions or sign important papers.  Meals: Start with liquid foods such as gelatin or soup. Progress to regular foods as tolerated. Avoid greasy, spicy, heavy foods. If nausea and/or vomiting occur, drink only clear liquids until the nausea and/or vomiting subsides. Call your physician if vomiting continues.  Special Instructions/Symptoms: Your throat may feel dry or sore from the anesthesia or the breathing tube placed in your throat during surgery. If this causes discomfort, gargle with warm salt water. The discomfort should disappear within  24 hours.     Regional Anesthesia Blocks  1. Numbness or the inability to move the "blocked" extremity may last from 3-48 hours after placement. The length of time depends on the medication injected and your individual response to the medication. If the numbness is not going away after 48 hours, call your surgeon.  2. The extremity that is blocked will need to be protected until the numbness is gone and the  Strength has returned. Because you cannot feel it, you will need to take extra care to avoid injury. Because it may be weak, you may have difficulty moving it or using it. You may not know what position it is in without looking at it while the block is in effect.  3. For blocks in the legs and feet, returning to weight bearing and walking needs to be done carefully. You will need to wait until the numbness is entirely gone and the strength has returned. You should be able to move your leg and foot normally before you try and bear weight or walk. You will need someone to be with you when you first try to ensure you do not fall and possibly risk injury.  4. Bruising and tenderness at the needle site are common side effects and will resolve in a few days.  5. Persistent numbness or new problems with movement should be communicated to the surgeon or the [facility] ([phone])/ [facility] ([phone]).  

## 2020-05-02 NOTE — Op Note (Signed)
05/02/2020 [facility]                              OPERATIVE REPORT   PREOPERATIVE DIAGNOSIS:  Right carpal tunnel syndrome.  POSTOPERATIVE DIAGNOSIS:  Right carpal tunnel syndrome.  PROCEDURE:  Right carpal tunnel release.  SURGEON:  Leanora Cover, MD  ASSISTANT:  none.  ANESTHESIA: Bier block with sedation  IV FLUIDS:  Per anesthesia flow sheet.  ESTIMATED BLOOD LOSS:  Minimal.  COMPLICATIONS:  None.  SPECIMENS:  None.  TOURNIQUET TIME:    Total Tourniquet Time Documented: Forearm (Right) - 28 minutes Total: Forearm (Right) - 28 minutes   DISPOSITION:  Stable to PACU.  LOCATION:  SURGERY CENTER  INDICATIONS:  59 yo male with numbness and tingling right hand.  Positive nerve conduction studies.  He wishes to have a carpal tunnel release for management of his symptoms.  Risks, benefits and alternatives of surgery were discussed including the risk of blood loss; infection; damage to nerves, vessels, tendons, ligaments, bone; failure of surgery; need for additional surgery; complications with wound healing; continued pain; recurrence of carpal tunnel syndrome; and damage to motor branch. He voiced understanding of these risks and elected to proceed.   OPERATIVE COURSE:  After being identified preoperatively by myself, the patient and I agreed upon the procedure and site of procedure.  The surgical site was marked.  The risks, benefits, and alternatives of the surgery were reviewed and he wished to proceed.  Surgical consent had been signed.  He was given IV Ancef as preoperative antibiotic prophylaxis.  He was transferred to the operating room and placed on the operating room table in supine position with the Right upper extremity on an armboard.  Bier block anesthesia was induced by the anesthesiologist.  This was augmented with lidocaine injection at the surgical field.  Right upper extremity was prepped and draped in normal sterile orthopaedic fashion.  A  surgical pause was performed between the surgeons, anesthesia, and operating room staff, and all were in agreement as to the patient, procedure, and site of procedure.  Tourniquet at the proximal aspect of the forearm had been inflated for the Bier block  Incision was made over the transverse carpal ligament and carried into the subcutaneous tissues by spreading technique.  Bipolar electrocautery was used to obtain hemostasis.  The palmar fascia was sharply incised.  The transverse carpal ligament was identified and sharply incised.  It was incised distally first.  The flexor tendons were identified.  The flexor tendon to the ring finger was identified and retracted radially.  The transverse carpal ligament was then incised proximally.  The ring finger flexor tendon was just under the ligament and a few fibers of the tendon were transected while releasing the ligament.  There were not enough fibers transected to require debridement of the edge or repair.  Scissors were used to split the distal aspect of the volar antebrachial fascia.  A finger was placed into the wound to ensure complete decompression, which was the case.  The nerve was examined.  It was adherent to the radial leaflet.  The motor branch was identified and was intact.  The wound was copiously irrigated with sterile saline.  It was then closed with 4-0 nylon in a horizontal mattress fashion.  It was dressed with sterile Xeroform, 4x4s, an ABD, and wrapped with Kerlix and an Ace bandage.  Tourniquet was deflated at 28 minutes.  Fingertips were pink with brisk capillary refill after deflation of the tourniquet.  Operative drapes were broken down.  The patient was awoken from anesthesia safely.  He was transferred back to stretcher and taken to the PACU in stable condition.  I will see him back in the office in 1 week for postoperative followup.  I will give him a prescription for Norco 5/325 1-2 tabs PO q6 hours prn pain, dispense # 20.    Leanora Cover, MD Electronically signed, 05/02/20

## 2020-05-02 NOTE — Anesthesia Preprocedure Evaluation (Addendum)
Anesthesia Evaluation  Patient identified by MRN, date of birth, ID band Patient awake    Reviewed: Allergy & Precautions, NPO status , Patient's Chart, lab work & pertinent test results  Airway Mallampati: II  TM Distance: >3 FB Neck ROM: Full    Dental  (+) Dental Advisory Given, Teeth Intact   Pulmonary Current Smoker,    Pulmonary exam normal breath sounds clear to auscultation       Cardiovascular hypertension, Pt. on medications and Pt. on home beta blockers Normal cardiovascular exam Rhythm:Regular Rate:Normal     Neuro/Psych    GI/Hepatic   Endo/Other    Renal/GU      Musculoskeletal  (+) Arthritis ,   Abdominal   Peds  Hematology   Anesthesia Other Findings   Reproductive/Obstetrics                           Anesthesia Physical Anesthesia Plan  ASA: II  Anesthesia Plan: Bier Block and Bier Block-LIDOCAINE ONLY   Post-op Pain Management:    Induction: Intravenous  PONV Risk Score and Plan: Treatment may vary due to age or medical condition and Propofol infusion  Airway Management Planned:   Additional Equipment: None  Intra-op Plan:   Post-operative Plan:   Informed Consent: I have reviewed the patients History and Physical, chart, labs and discussed the procedure including the risks, benefits and alternatives for the proposed anesthesia with the patient or authorized representative who has indicated his/her understanding and acceptance.     Dental advisory given  Plan Discussed with: CRNA  Anesthesia Plan Comments:        Anesthesia Quick Evaluation

## 2020-05-02 NOTE — Anesthesia Procedure Notes (Signed)
Date/Time: 05/02/2020 1:24 PM Performed by: Glory Buff, CRNA Oxygen Delivery Method: Simple face mask

## 2020-05-03 ENCOUNTER — Encounter (HOSPITAL_BASED_OUTPATIENT_CLINIC_OR_DEPARTMENT_OTHER): Payer: Self-pay | Admitting: Orthopedic Surgery

## 2020-07-22 ENCOUNTER — Other Ambulatory Visit: Payer: Self-pay | Admitting: Orthopedic Surgery

## 2020-09-19 ENCOUNTER — Encounter (HOSPITAL_BASED_OUTPATIENT_CLINIC_OR_DEPARTMENT_OTHER): Payer: Self-pay | Admitting: Orthopedic Surgery

## 2020-09-19 ENCOUNTER — Other Ambulatory Visit: Payer: Self-pay

## 2020-09-26 ENCOUNTER — Other Ambulatory Visit (HOSPITAL_COMMUNITY)
Admission: RE | Admit: 2020-09-26 | Discharge: 2020-09-26 | Disposition: A | Discharge status: Home / Self Care | Payer: 59 | Source: Ambulatory Visit | Attending: Orthopedic Surgery | Admitting: Orthopedic Surgery

## 2020-09-26 DIAGNOSIS — Z01812 Encounter for preprocedural laboratory examination: Secondary | ICD-10-CM | POA: Insufficient documentation

## 2020-09-26 DIAGNOSIS — Z20822 Contact with and (suspected) exposure to covid-19: Secondary | ICD-10-CM | POA: Insufficient documentation

## 2020-09-26 LAB — SARS CORONAVIRUS 2 (TAT 6-24 HRS): SARS Coronavirus 2: NEGATIVE

## 2020-09-26 NOTE — Anesthesia Preprocedure Evaluation (Addendum)
Anesthesia Evaluation  Patient identified by MRN, date of birth, ID band Patient awake    Reviewed: Allergy & Precautions, NPO status , Patient's Chart, lab work & pertinent test results  Airway Mallampati: II  TM Distance: >3 FB Neck ROM: Full    Dental no notable dental hx. (+) Teeth Intact, Dental Advisory Given   Pulmonary Current Smoker and Patient abstained from smoking.,    Pulmonary exam normal breath sounds clear to auscultation       Cardiovascular Exercise Tolerance: Good hypertension, Pt. on medications and Pt. on home beta blockers negative cardio ROS Normal cardiovascular exam Rhythm:Regular Rate:Normal  08/2018 Echo  Baseline:   - The estimated LV ejection fraction was 60%.  - Normal wall motion; no LV regional wall motion abnormalities   Neuro/Psych negative neurological ROS  negative psych ROS   GI/Hepatic negative GI ROS, Neg liver ROS,   Endo/Other  negative endocrine ROS  Renal/GU negative Renal ROS     Musculoskeletal  (+) Arthritis ,   Abdominal   Peds  Hematology negative hematology ROS (+)   Anesthesia Other Findings   Reproductive/Obstetrics                            Anesthesia Physical Anesthesia Plan  ASA: II  Anesthesia Plan: Bier Block and Bier Block-LIDOCAINE ONLY   Post-op Pain Management:    Induction:   PONV Risk Score and Plan: 1 and Treatment may vary due to age or medical condition, Midazolam and Ondansetron  Airway Management Planned: Nasal Cannula and Natural Airway  Additional Equipment: None  Intra-op Plan:   Post-operative Plan:   Informed Consent: I have reviewed the patients History and Physical, chart, labs and discussed the procedure including the risks, benefits and alternatives for the proposed anesthesia with the patient or authorized representative who has indicated his/her understanding and acceptance.     Dental  advisory given  Plan Discussed with: CRNA and Anesthesiologist  Anesthesia Plan Comments: (L bier block)       Anesthesia Quick Evaluation

## 2020-09-27 ENCOUNTER — Ambulatory Visit (HOSPITAL_BASED_OUTPATIENT_CLINIC_OR_DEPARTMENT_OTHER)
Admission: RE | Admit: 2020-09-27 | Discharge: 2020-09-27 | Disposition: A | Discharge status: Home / Self Care | Payer: 59 | Attending: Orthopedic Surgery | Admitting: Orthopedic Surgery

## 2020-09-27 ENCOUNTER — Other Ambulatory Visit: Payer: Self-pay

## 2020-09-27 ENCOUNTER — Encounter (HOSPITAL_BASED_OUTPATIENT_CLINIC_OR_DEPARTMENT_OTHER)
Admission: RE | Disposition: A | Discharge status: Home / Self Care | Payer: Self-pay | Source: Home / Self Care | Attending: Orthopedic Surgery

## 2020-09-27 ENCOUNTER — Ambulatory Visit (HOSPITAL_BASED_OUTPATIENT_CLINIC_OR_DEPARTMENT_OTHER): Payer: 59 | Admitting: Anesthesiology

## 2020-09-27 ENCOUNTER — Encounter (HOSPITAL_BASED_OUTPATIENT_CLINIC_OR_DEPARTMENT_OTHER): Payer: Self-pay | Admitting: Orthopedic Surgery

## 2020-09-27 DIAGNOSIS — G5602 Carpal tunnel syndrome, left upper limb: Secondary | ICD-10-CM | POA: Insufficient documentation

## 2020-09-27 DIAGNOSIS — Z79899 Other long term (current) drug therapy: Secondary | ICD-10-CM | POA: Diagnosis not present

## 2020-09-27 DIAGNOSIS — F17210 Nicotine dependence, cigarettes, uncomplicated: Secondary | ICD-10-CM | POA: Insufficient documentation

## 2020-09-27 HISTORY — PX: CARPAL TUNNEL RELEASE: SHX101

## 2020-09-27 SURGERY — CARPAL TUNNEL RELEASE
Anesthesia: Regional | Site: Hand | Laterality: Left

## 2020-09-27 MED ORDER — BUPIVACAINE HCL (PF) 0.25 % IJ SOLN
INTRAMUSCULAR | Status: AC
  Filled 2020-09-27: qty 30

## 2020-09-27 MED ORDER — ONDANSETRON HCL 4 MG/2ML IJ SOLN
INTRAMUSCULAR | Status: DC | PRN
  Administered 2020-09-27: 4 mg via INTRAVENOUS

## 2020-09-27 MED ORDER — HYDROCODONE-ACETAMINOPHEN 5-325 MG PO TABS: ORAL_TABLET | ORAL | 0 refills | Status: DC

## 2020-09-27 MED ORDER — FENTANYL CITRATE (PF) 100 MCG/2ML IJ SOLN
INTRAMUSCULAR | Status: DC | PRN
  Administered 2020-09-27: 50 ug via INTRAVENOUS

## 2020-09-27 MED ORDER — CEFAZOLIN SODIUM-DEXTROSE 2-4 GM/100ML-% IV SOLN
INTRAVENOUS | Status: AC
  Filled 2020-09-27: qty 100

## 2020-09-27 MED ORDER — MIDAZOLAM HCL 5 MG/5ML IJ SOLN
INTRAMUSCULAR | Status: DC | PRN
  Administered 2020-09-27: 2 mg via INTRAVENOUS

## 2020-09-27 MED ORDER — FENTANYL CITRATE (PF) 100 MCG/2ML IJ SOLN: 25.0000 ug | INTRAMUSCULAR | Status: DC | PRN

## 2020-09-27 MED ORDER — FENTANYL CITRATE (PF) 100 MCG/2ML IJ SOLN
INTRAMUSCULAR | Status: AC
  Filled 2020-09-27: qty 2

## 2020-09-27 MED ORDER — LIDOCAINE HCL (CARDIAC) PF 100 MG/5ML IV SOSY
PREFILLED_SYRINGE | INTRAVENOUS | Status: DC | PRN
  Administered 2020-09-27: 40 mg via INTRAVENOUS

## 2020-09-27 MED ORDER — MIDAZOLAM HCL 2 MG/2ML IJ SOLN
INTRAMUSCULAR | Status: AC
  Filled 2020-09-27: qty 2

## 2020-09-27 MED ORDER — LIDOCAINE 2% (20 MG/ML) 5 ML SYRINGE
INTRAMUSCULAR | Status: AC
  Filled 2020-09-27: qty 5

## 2020-09-27 MED ORDER — PROPOFOL 500 MG/50ML IV EMUL
INTRAVENOUS | Status: DC | PRN
  Administered 2020-09-27: 100 ug/kg/min via INTRAVENOUS

## 2020-09-27 MED ORDER — CEFAZOLIN SODIUM-DEXTROSE 2-4 GM/100ML-% IV SOLN
2.0000 g | INTRAVENOUS | Status: AC
  Administered 2020-09-27: 13:00:00 2 g via INTRAVENOUS

## 2020-09-27 MED ORDER — LACTATED RINGERS IV SOLN: INTRAVENOUS | Status: DC

## 2020-09-27 MED ORDER — ACETAMINOPHEN 10 MG/ML IV SOLN: 1000.0000 mg | Freq: Once | INTRAVENOUS | Status: DC | PRN

## 2020-09-27 MED ORDER — ONDANSETRON HCL 4 MG/2ML IJ SOLN: 4.0000 mg | Freq: Once | INTRAMUSCULAR | Status: DC | PRN

## 2020-09-27 MED ORDER — ACETAMINOPHEN 500 MG PO TABS
ORAL_TABLET | ORAL | Status: AC
  Filled 2020-09-27: qty 2

## 2020-09-27 MED ORDER — BUPIVACAINE HCL (PF) 0.25 % IJ SOLN
INTRAMUSCULAR | Status: DC | PRN
  Administered 2020-09-27: 8.5 mL

## 2020-09-27 MED ORDER — PROPOFOL 10 MG/ML IV BOLUS
INTRAVENOUS | Status: DC | PRN
  Administered 2020-09-27: 10 mg via INTRAVENOUS
  Administered 2020-09-27: 20 mg via INTRAVENOUS

## 2020-09-27 SURGICAL SUPPLY — 39 items
APL PRP STRL LF DISP 70% ISPRP (MISCELLANEOUS) ×1
BLADE SURG 15 STRL LF DISP TIS (BLADE) ×2 IMPLANT
BLADE SURG 15 STRL SS (BLADE) ×4
BNDG CMPR 9X4 STRL LF SNTH (GAUZE/BANDAGES/DRESSINGS)
BNDG ELASTIC 3X5.8 VLCR STR LF (GAUZE/BANDAGES/DRESSINGS) ×2 IMPLANT
BNDG ESMARK 4X9 LF (GAUZE/BANDAGES/DRESSINGS) IMPLANT
BNDG GAUZE ELAST 4 BULKY (GAUZE/BANDAGES/DRESSINGS) ×2 IMPLANT
CHLORAPREP W/TINT 26 (MISCELLANEOUS) ×2 IMPLANT
CORD BIPOLAR FORCEPS 12FT (ELECTRODE) ×2 IMPLANT
COVER BACK TABLE 60X90IN (DRAPES) ×2 IMPLANT
COVER MAYO STAND STRL (DRAPES) ×2 IMPLANT
COVER WAND RF STERILE (DRAPES) IMPLANT
CUFF TOURN SGL QUICK 18X4 (TOURNIQUET CUFF) ×2 IMPLANT
DRAPE EXTREMITY T 121X128X90 (DISPOSABLE) ×2 IMPLANT
DRAPE SURG 17X23 STRL (DRAPES) ×2 IMPLANT
DRSG PAD ABDOMINAL 8X10 ST (GAUZE/BANDAGES/DRESSINGS) ×2 IMPLANT
GAUZE SPONGE 4X4 12PLY STRL (GAUZE/BANDAGES/DRESSINGS) ×2 IMPLANT
GAUZE XEROFORM 1X8 LF (GAUZE/BANDAGES/DRESSINGS) ×2 IMPLANT
GLOVE BIO SURGEON STRL SZ7 (GLOVE) ×2 IMPLANT
GLOVE BIO SURGEON STRL SZ7.5 (GLOVE) ×2 IMPLANT
GLOVE BIOGEL PI IND STRL 7.5 (GLOVE) ×1 IMPLANT
GLOVE BIOGEL PI INDICATOR 7.5 (GLOVE) ×1
GLOVE SRG 8 PF TXTR STRL LF DI (GLOVE) ×1 IMPLANT
GLOVE SURG UNDER POLY LF SZ6.5 (GLOVE) ×2 IMPLANT
GLOVE SURG UNDER POLY LF SZ8 (GLOVE) ×2
GOWN STRL REUS W/ TWL LRG LVL3 (GOWN DISPOSABLE) ×1 IMPLANT
GOWN STRL REUS W/TWL LRG LVL3 (GOWN DISPOSABLE) ×2
GOWN STRL REUS W/TWL XL LVL3 (GOWN DISPOSABLE) ×2 IMPLANT
NEEDLE HYPO 25X1 1.5 SAFETY (NEEDLE) ×2 IMPLANT
NS IRRIG 1000ML POUR BTL (IV SOLUTION) ×2 IMPLANT
PACK BASIN DAY SURGERY FS (CUSTOM PROCEDURE TRAY) ×2 IMPLANT
PADDING CAST ABS 4INX4YD NS (CAST SUPPLIES) ×1
PADDING CAST ABS COTTON 4X4 ST (CAST SUPPLIES) ×1 IMPLANT
STOCKINETTE 4X48 STRL (DRAPES) ×2 IMPLANT
SUT ETHILON 4 0 PS 2 18 (SUTURE) ×2 IMPLANT
SYR BULB EAR ULCER 3OZ GRN STR (SYRINGE) ×2 IMPLANT
SYR CONTROL 10ML LL (SYRINGE) ×2 IMPLANT
TOWEL GREEN STERILE FF (TOWEL DISPOSABLE) ×4 IMPLANT
UNDERPAD 30X36 HEAVY ABSORB (UNDERPADS AND DIAPERS) ×2 IMPLANT

## 2020-09-27 NOTE — Anesthesia Procedure Notes (Signed)
Anesthesia Regional Block: Bier block (IV Regional)   Pre-Anesthetic Checklist: ,, timeout performed, Correct Patient, Correct Site, Correct Laterality, Correct Procedure, Correct Position, site marked, Risks and benefits discussed, Surgical consent,  Pre-op evaluation,  At surgeon's request  Laterality: Left  Prep: alcohol swabs        Procedures:,,,,, intact distal pulses, Esmarch exsanguination, single tourniquet utilized,  Narrative:  Start time: 09/27/2020 1:30 PM End time: 09/27/2020 1:31 PM  Performed by: Personally

## 2020-09-27 NOTE — H&P (Signed)
Jermaine Rose is an 59 y.o. male.   Chief Complaint: carpal tunnel syndrome HPI: 59 yo male with numbness and tingling in left hand.  Nocturnal symptoms.  Positive nerve conduction studies.  Has been happy with right carpal tunnel release and wishes to have left carpal tunnel release.  Allergies:  Allergies  Allergen Reactions  . Lisinopril     Ace inhibitors- Swelling; Angio-Edema    Past Medical History:  Diagnosis Date  . Arthritis   . External hemorrhoids   . Foot pain   . Hypertension 08/13/2018  . MRSA infection    6-8 years ago in his neck per patient  . Numbness and tingling in both hands   . Syncope 08/13/2018   had Cath at [place] but no follow up needed per patient  . URI (upper respiratory infection) 08/13/2018    Past Surgical History:  Procedure Laterality Date  . ANKLE SURGERY Right 2008,2009  . CARPAL TUNNEL RELEASE Right 05/02/2020   Procedure: CARPAL TUNNEL RELEASE;  Surgeon: Leanora Cover, MD;  Location: [facility];  Service: Orthopedics;  Laterality: Right;  . KNEE SURGERY Bilateral     Family History: Family History  Problem Relation Age of Onset  . Breast cancer Mother   . Atrial fibrillation Mother   . Atrial fibrillation Father     Social History:   reports that he has been smoking cigarettes. He has been smoking about 1.00 pack per day. He has never used smokeless tobacco. He reports current alcohol use of about 3.0 standard drinks of alcohol per week. He reports previous drug use.  Medications: Medications Prior to Admission  Medication Sig Dispense Refill  . amLODipine (NORVASC) 10 MG tablet Take 10 mg by mouth daily.    . metoprolol tartrate (LOPRESSOR) 50 MG tablet Take 50 mg by mouth 2 (two) times daily.    . psyllium (METAMUCIL) 58.6 % packet Take 1 packet by mouth daily.      Results for orders placed or performed during the hospital encounter of 09/26/20 (from the past 48 hour(s))  SARS CORONAVIRUS 2 (TAT 6-24 HRS)  Nasopharyngeal Nasopharyngeal Swab     Status: None   Collection Time: 09/26/20 10:27 AM   Specimen: Nasopharyngeal Swab  Result Value Ref Range   SARS Coronavirus 2 NEGATIVE NEGATIVE    Comment: (NOTE) SARS-CoV-2 target nucleic acids are NOT DETECTED.  The SARS-CoV-2 RNA is generally detectable in upper and lower respiratory specimens during the acute phase of infection. Negative results do not preclude SARS-CoV-2 infection, do not rule out co-infections with other pathogens, and should not be used as the sole basis for treatment or other patient management decisions. Negative results must be combined with clinical observations, patient history, and epidemiological information. The expected result is Negative.  Fact Sheet for Patients: SugarRoll.be  Fact Sheet for Healthcare Providers: https://www.woods-mathews.com/  This test is not yet approved or cleared by the [place] FDA and  has been authorized for detection and/or diagnosis of SARS-CoV-2 by FDA under an Emergency Use Authorization (EUA). This EUA will remain  in effect (meaning this test can be used) for the duration of the COVID-19 declaration under Se ction 564(b)(1) of the Act, 21 U.S.C. section 360bbb-3(b)(1), unless the authorization is terminated or revoked sooner.  Performed at [facility], [place] [address] [place], [place] [address]     No results found.   A comprehensive review of systems was negative.  Blood pressure 129/84, pulse 74, temperature 98.5 F (36.9  C), temperature source Oral, resp. rate 18, height 6\' 1"  (1.854 m), weight 86.4 kg, SpO2 100 %.  General appearance: alert, cooperative and appears stated age Head: Normocephalic, without obvious abnormality, atraumatic Neck: supple, symmetrical, trachea midline Cardio: regular rate and rhythm Resp: clear to auscultation bilaterally Extremities: Intact sensation and capillary refill all  digits.  +epl/fpl/io.  No wounds.  Pulses: 2+ and symmetric Skin: Skin color, texture, turgor normal. No rashes or lesions Neurologic: Grossly normal Incision/Wound: none  Assessment/Plan Left carpal tunnel syndrome.  Non operative and operative treatment options have been discussed with the patient and patient wishes to proceed with operative treatment. Risks, benefits, and alternatives of surgery have been discussed and the patient agrees with the plan of care.   09/27/2020, 12:18 PM

## 2020-09-27 NOTE — Anesthesia Postprocedure Evaluation (Signed)
Anesthesia Post Note  Patient: Amour Zietlow  Procedure(s) Performed: CARPAL TUNNEL RELEASE (Left Hand)     Patient location during evaluation: PACU Anesthesia Type: Bier Block Level of consciousness: awake and alert Pain management: pain level controlled Vital Signs Assessment: post-procedure vital signs reviewed and stable Respiratory status: spontaneous breathing, nonlabored ventilation, respiratory function stable and patient connected to nasal cannula oxygen Cardiovascular status: stable and blood pressure returned to baseline Postop Assessment: no apparent nausea or vomiting Anesthetic complications: no   No complications documented.  Last Vitals:  Vitals:   09/27/20 1144 09/27/20 1400  BP: 129/84 111/75  Pulse: 74 67  Resp: 18 15  Temp: 36.9 C   SpO2: 100% 100%    Last Pain:  Vitals:   09/27/20 1144  TempSrc: Oral  PainSc: 0-No pain                 Trevor Iha

## 2020-09-27 NOTE — Op Note (Signed)
09/27/2020 [facility]                              OPERATIVE REPORT   PREOPERATIVE DIAGNOSIS:  Left carpal tunnel syndrome.  POSTOPERATIVE DIAGNOSIS:  Left carpal tunnel syndrome.  PROCEDURE:  Left carpal tunnel release.  SURGEON:  Leanora Cover, MD  ASSISTANT:  none.  ANESTHESIA: Bier block with sedation  IV FLUIDS:  Per anesthesia flow sheet.  ESTIMATED BLOOD LOSS:  Minimal.  COMPLICATIONS:  None.  SPECIMENS:  None.  TOURNIQUET TIME:    Total Tourniquet Time Documented: Forearm (Left) - 25 minutes Total: Forearm (Left) - 25 minutes   DISPOSITION:  Stable to PACU.  LOCATION: [facility]  INDICATIONS:  59 yo male with numbness and tingling left hand.  Nocturnal symptoms.  Positive nerve conduction studies.   He wishes to have a carpal tunnel release for management of his symptoms.  Risks, benefits and alternatives of surgery were discussed including the risk of blood loss; infection; damage to nerves, vessels, tendons, ligaments, bone; failure of surgery; need for additional surgery; complications with wound healing; continued pain; recurrence of carpal tunnel syndrome; and damage to motor branch. He voiced understanding of these risks and elected to proceed.   OPERATIVE COURSE:  After being identified preoperatively by myself, the patient and I agreed upon the procedure and site of procedure.  The surgical site was marked.  Surgical consent had been signed.  He was given preoperative IV antibiotic prophylaxis.  He was transferred to the operating room and placed on the operating room table in supine position with the Left upper extremity on an armboard.  Bier block anesthesia was induced by the anesthesiologist.  Left upper extremity was prepped and draped in normal sterile orthopaedic fashion.  A surgical pause was performed between the surgeons, anesthesia, and operating room staff, and all were in agreement as to the patient, procedure, and site  of procedure.  Tourniquet at the proximal aspect of the forearm had been inflated for the Bier block  Incision was made over the transverse carpal ligament and carried into the subcutaneous tissues by spreading technique.  Bipolar electrocautery was used to obtain hemostasis.  The palmar fascia was sharply incised.  The transverse carpal ligament was identified and sharply incised.  It was incised distally first.  The flexor tendons were identified.  The flexor tendon to the ring finger was identified and retracted radially.  The transverse carpal ligament was then incised proximally.  Scissors were used to split the distal aspect of the volar antebrachial fascia.  A finger was placed into the wound to ensure complete decompression, which was the case.  The nerve was examined.  It was flattened and hyperemic.  The motor branch was identified and was intact.  The wound was copiously irrigated with sterile saline.  It was then closed with 4-0 nylon in a horizontal mattress fashion.  It was injected with 0.25% plain Marcaine to aid in postoperative analgesia.  It was dressed with sterile Xeroform, 4x4s, an ABD, and wrapped with Kerlix and an Ace bandage.  Tourniquet was deflated at 25 minutes.  Fingertips were pink with brisk capillary refill after deflation of the tourniquet.  Operative drapes were broken down.  The patient was awoken from anesthesia safely.  He was transferred back to stretcher and taken to the PACU in stable condition.  I will see him back in the office in 1 week  for postoperative followup.  I will give him a prescription for Norco 5/325 1-2 tabs PO q6 hours prn pain, dispense # 20.    Betha Loa, MD Electronically signed, 09/27/20

## 2020-09-27 NOTE — Transfer of Care (Signed)
Immediate Anesthesia Transfer of Care Note  Patient: Jermaine Rose  Procedure(s) Performed: CARPAL TUNNEL RELEASE (Left Hand)  Patient Location: PACU  Anesthesia Type:Bier block  Level of Consciousness: awake, alert  and oriented  Airway & Oxygen Therapy: Patient Spontanous Breathing and Patient connected to face mask oxygen  Post-op Assessment: Report given to RN and Post -op Vital signs reviewed and stable  Post vital signs: Reviewed and stable  Last Vitals:  Vitals Value Taken Time  BP    Temp    Pulse 67 09/27/20 1400  Resp 15 09/27/20 1400  SpO2 100 % 09/27/20 1400  Vitals shown include unvalidated device data.  Last Pain:  Vitals:   09/27/20 1144  TempSrc: Oral  PainSc: 0-No pain         Complications: No complications documented.

## 2020-09-27 NOTE — Discharge Instructions (Signed)

## 2020-09-28 ENCOUNTER — Encounter (HOSPITAL_BASED_OUTPATIENT_CLINIC_OR_DEPARTMENT_OTHER): Payer: Self-pay | Admitting: Orthopedic Surgery

## 2020-12-28 ENCOUNTER — Telehealth: Payer: Self-pay | Admitting: Cardiology

## 2020-12-28 NOTE — Telephone Encounter (Signed)
Pt has an appointment for 12/30/20.

## 2020-12-28 NOTE — Telephone Encounter (Signed)
Kennyth Lose is calling stating Jermaine Rose was recently hospitalized at [facility] for Afib on 12/25/20. She states he was advised to follow up within 5 days with a Cardiologist and also advised he cannot return to work until he does. Patient is still considered a pt of Dr. Geraldo Pitter up until November of this year. The first available at this time is not until 01/09/21. Please advise.

## 2020-12-29 ENCOUNTER — Encounter: Payer: Self-pay | Admitting: Cardiology

## 2020-12-29 ENCOUNTER — Other Ambulatory Visit: Payer: Self-pay

## 2020-12-29 DIAGNOSIS — M79673 Pain in unspecified foot: Secondary | ICD-10-CM | POA: Insufficient documentation

## 2020-12-29 DIAGNOSIS — I4891 Unspecified atrial fibrillation: Secondary | ICD-10-CM | POA: Insufficient documentation

## 2020-12-29 DIAGNOSIS — R200 Anesthesia of skin: Secondary | ICD-10-CM | POA: Insufficient documentation

## 2020-12-29 DIAGNOSIS — M1990 Unspecified osteoarthritis, unspecified site: Secondary | ICD-10-CM | POA: Insufficient documentation

## 2020-12-29 DIAGNOSIS — E871 Hypo-osmolality and hyponatremia: Secondary | ICD-10-CM

## 2020-12-29 DIAGNOSIS — D169 Benign neoplasm of bone and articular cartilage, unspecified: Secondary | ICD-10-CM | POA: Insufficient documentation

## 2020-12-29 DIAGNOSIS — A4902 Methicillin resistant Staphylococcus aureus infection, unspecified site: Secondary | ICD-10-CM | POA: Insufficient documentation

## 2020-12-29 DIAGNOSIS — G5600 Carpal tunnel syndrome, unspecified upper limb: Secondary | ICD-10-CM | POA: Insufficient documentation

## 2020-12-29 DIAGNOSIS — R202 Paresthesia of skin: Secondary | ICD-10-CM | POA: Insufficient documentation

## 2020-12-29 DIAGNOSIS — K644 Residual hemorrhoidal skin tags: Secondary | ICD-10-CM | POA: Insufficient documentation

## 2020-12-30 ENCOUNTER — Ambulatory Visit: Payer: 59 | Admitting: Cardiology

## 2020-12-30 ENCOUNTER — Other Ambulatory Visit: Payer: Self-pay

## 2020-12-30 ENCOUNTER — Encounter: Payer: Self-pay | Admitting: Cardiology

## 2020-12-30 VITALS — BP 126/86 | HR 85 | Ht 73.0 in | Wt 198.4 lb

## 2020-12-30 DIAGNOSIS — I4891 Unspecified atrial fibrillation: Secondary | ICD-10-CM

## 2020-12-30 DIAGNOSIS — F17210 Nicotine dependence, cigarettes, uncomplicated: Secondary | ICD-10-CM

## 2020-12-30 DIAGNOSIS — I10 Essential (primary) hypertension: Secondary | ICD-10-CM | POA: Diagnosis not present

## 2020-12-30 HISTORY — DX: Unspecified atrial fibrillation: I48.91

## 2020-12-30 NOTE — Progress Notes (Signed)
Cardiology Office Note:    Date:  12/30/2020   ID:  Jermaine Rose, DOB Apr 08, 1961, MRN [id number]  PCP:  Elenore Paddy, NP  Cardiologist:  Jenean Lindau, MD   Referring MD: Elenore Paddy, NP    ASSESSMENT:    1. Primary hypertension   2. Atrial fibrillation, unspecified type ([place])   3. Cigarette smoker    PLAN:    In order of problems listed above:  1. Primary prevention stressed with the patient.  Importance of diet medication stressed and vocalized understanding. 2. Newly diagnosed atrial fibrillation: I discussed my findings with the patient at extensive length.  His CHADS2 score is low but the patient is on anticoagulation.  This was initiated by the emergency room.  He is taking it very regularly.  He is in a big hurry to get back into normal rhythm as he is not feeling his usual self.  He feels very tired and he feels that messes up his mind and is unable to work and that that sound to his stress.  His wife agrees.  For this reason I suggested to him TEE and guided cardioversion and is agreeable and we will schedule him for the same.  Procedure, benefits and potential risks explained to the patient and vocalized understanding.  Questions were answered to satisfaction. 3. Essential hypertension: Blood pressure stable and diet was emphasized.  Lifestyle modification and salt intake issues urged. 4. Cigarette smoker: I spent 5 minutes with the patient discussing solely about smoking. Smoking cessation was counseled. I suggested to the patient also different medications and pharmacological interventions. Patient is keen to try stopping on its own at this time. He will get back to me if he needs any further assistance in this matter. 5. Patient will be seen in follow-up appointment in 2 months or earlier if the patient has any concerns    Medication Adjustments/Labs and Tests Ordered: Current medicines are reviewed at length with the patient today.  Concerns regarding medicines are  outlined above.  No orders of the defined types were placed in this encounter.  No orders of the defined types were placed in this encounter.    History of Present Illness:    Jermaine Rose is a 60 y.o. male who is being seen today for the evaluation of atrial fibrillation at the request of Elenore Paddy, NP.  Patient is a pleasant 60 year old male.  He has past medical history of essential hypertension and is an active smoker.  He is a Development worker, community carrier.  He mentions to me that recently started feeling tired and felt his heart going fast so he went to the emergency room and was found to be in atrial fibrillation with elevated ventricular rate.  Calcium channel blocker was added to his regimen and he feels better.  He is not completely back to his normal self.  He tells me that he wants to get this taken care of quickly as he is feeling very tired and cannot go back to work.  His primary care physician has kept him off work.  At the time of my evaluation, the patient is alert awake oriented and in no distress.  Past Medical History:  Diagnosis Date  . Arthritis   . Atrial fibrillation ([place])   . Bunionette of right foot 05/23/2018  . Carpal tunnel syndrome   . Cigarette smoker 08/19/2018  . Dyspnea on exertion 08/19/2018  . External hemorrhoids   . Foot pain   . Hypertension 08/13/2018  .  Hyponatremia   . MRSA infection    6-8 years ago in his neck per patient  . Numbness and tingling in both hands   . Osteochondroma   . Peroneal tendonitis, right 03/12/2017  . Primary osteoarthritis of both first carpometacarpal joints 02/15/2020  . Syncope 08/13/2018   had Cath at [place] but no follow up needed per patient  . URI (upper respiratory infection) 08/13/2018    Past Surgical History:  Procedure Laterality Date  . ANKLE SURGERY Right 2008,2009  . CARPAL TUNNEL RELEASE Right 05/02/2020   Procedure: CARPAL TUNNEL RELEASE;  Surgeon: Leanora Cover, MD;  Location: [facility];   Service: Orthopedics;  Laterality: Right;  . CARPAL TUNNEL RELEASE Left 09/27/2020   Procedure: CARPAL TUNNEL RELEASE;  Surgeon: Leanora Cover, MD;  Location: [facility];  Service: Orthopedics;  Laterality: Left;  Bier block  . KNEE SURGERY Bilateral    Left in 2007 and Right in 1981    Current Medications: Current Meds  Medication Sig  . amLODipine (NORVASC) 10 MG tablet Take 10 mg by mouth daily.  Marland Kitchen diltiazem (CARDIZEM CD) 180 MG 24 hr capsule Take 180 mg by mouth daily.  . indapamide (LOZOL) 2.5 MG tablet Take 2.5 mg by mouth daily.  . metoprolol succinate (TOPROL-XL) 50 MG 24 hr tablet Take 50 mg by mouth daily.  . Multiple Vitamin (MULTIVITAMIN) tablet Take 1 tablet by mouth daily.  . psyllium (METAMUCIL) 58.6 % packet Take 1 packet by mouth daily.     Allergies:   Lisinopril   Social History   Socioeconomic History  . Marital status: Married    Spouse name: Not on file  . Number of children: 1  . Years of education: Not on file  . Highest education level: High school graduate  Occupational History    Comment: [employer]  Tobacco Use  . Smoking status: Current Every Day Smoker    Packs/day: 1.00    Years: 15.00    Pack years: 15.00    Types: Cigarettes  . Smokeless tobacco: Never Used  Vaping Use  . Vaping Use: Never used  Substance and Sexual Activity  . Alcohol use: Yes    Alcohol/week: 7.0 - 14.0 standard drinks    Types: 7 - 14 Cans of beer per week  . Drug use: Not Currently  . Sexual activity: Not on file  Other Topics Concern  . Not on file  Social History Narrative   Lives with wife   caffeine use heavy   Social Determinants of Health   Financial Resource Strain: Not on file  Food Insecurity: Not on file  Transportation Needs: Not on file  Physical Activity: Not on file  Stress: Not on file  Social Connections: Not on file     Family History: The patient's family history includes Atrial fibrillation in his father and mother; Breast  cancer in his mother; CAD in his father; Heart attack in his father; Hypertension in his father.  ROS:   Please see the history of present illness.    All other systems reviewed and are negative.  EKGs/Labs/Other Studies Reviewed:    The following studies were reviewed today: EKG reveals atrial fibrillation with well-controlled ventricular rate.   Recent Labs: No results found for requested labs within last 8760 hours.  Recent Lipid Panel No results found for: CHOL, TRIG, HDL, CHOLHDL, VLDL, LDLCALC, LDLDIRECT  Physical Exam:    VS:  BP 126/86   Pulse 85   Ht 6'  1" (1.854 m)   Wt 198 lb 6.4 oz (90 kg)   SpO2 98%   BMI 26.18 kg/m     Wt Readings from Last 3 Encounters:  12/30/20 198 lb 6.4 oz (90 kg)  12/28/19 199 lb (90.3 kg)  09/27/20 190 lb 7.6 oz (86.4 kg)     GEN: Patient is in no acute distress HEENT: Normal NECK: No JVD; No carotid bruits LYMPHATICS: No lymphadenopathy CARDIAC: S1 S2 irregular, 2/6 systolic murmur at the apex. RESPIRATORY:  Clear to auscultation without rales, wheezing or rhonchi  ABDOMEN: Soft, non-tender, non-distended MUSCULOSKELETAL:  No edema; No deformity  SKIN: Warm and dry NEUROLOGIC:  Alert and oriented x 3 PSYCHIATRIC:  Normal affect    Signed, Jenean Lindau, MD  12/30/2020 8:38 AM    [facility]

## 2020-12-30 NOTE — Patient Instructions (Signed)
Medication Instructions:  No medication changes. *If you need a refill on your cardiac medications before your next appointment, please call your pharmacy*   Lab Work: Your physician recommends that you have labs done in the office today. Your test included  basic metabolic panel and complete blood count.  If you have labs (blood work) drawn today and your tests are completely normal, you will receive your results only by: Marland Kitchen MyChart Message (if you have MyChart) OR . A paper copy in the mail If you have any lab test that is abnormal or we need to change your treatment, we will call you to review the results.   Testing/Procedures: Your physician has requested that you have a TEE/Cardioversion. During a TEE, sound waves are used to create images of your heart. It provides your doctor with information about the size and shape of your heart and how well your heart's chambers and valves are working. In this test, a transducer is attached to the end of a flexible tube that is guided down you throat and into your esophagus (the tube leading from your mouth to your stomach) to get a more detailed image of your heart. Once the TEE has determined that a blood clot is not present, the cardioversion begins. Electrical Cardioversion uses a jolt of electricity to your heart either through paddles or wired patches attached to your chest. This is a controlled, usually prescheduled, procedure. This procedure is done at the hospital and you are not awake during the procedure. You usually go home the day of the procedure. Please see the instruction sheet given to you today for more information.   You are scheduled for a TEE/Cardioversion/TEE Cardioversion on 01/06/2021 with Dr. Harrell Gave.  Please arrive at the [facility] (Main Entrance A) at [facility]: [address] [place], [place] [address] at 9:00 am. (1 hour prior to procedure unless lab work is needed; if lab work is needed arrive 1.5 hours  ahead)  DIET: Nothing to eat or drink after midnight except a sip of water with medications (see medication instructions below)   Medication Instructions: Continue your anticoagulant: Eliquis You will need to continue your anticoagulant after your procedure             until you are told by your provider that it is safe to stop.  You must have a responsible person to drive you home and stay in the waiting area during your procedure. Failure to do so could result in cancellation.  Bring your insurance cards.  *Special Note: Every effort is made to have your procedure done on time. Occasionally there are emergencies that occur at the hospital that may cause delays. Please be patient if a delay does occur.     Follow-Up: At [facility], you and your health needs are our priority.  As part of our continuing mission to provide you with exceptional heart care, we have created designated Provider Care Teams.  These Care Teams include your primary Cardiologist (physician) and Advanced Practice Providers (APPs -  Physician Assistants and Nurse Practitioners) who all work together to provide you with the care you need, when you need it.  We recommend signing up for the patient portal called "MyChart".  Sign up information is provided on this After Visit Summary.  MyChart is used to connect with patients for Virtual Visits (Telemedicine).  Patients are able to view lab/test results, encounter notes, upcoming appointments, etc.  Non-urgent messages can be sent to your provider as  well.   To learn more about what you can do with MyChart, go to [email or web address].    Your next appointment:   2 month(s)  The format for your next appointment:   In Person  Provider:   Jyl Heinz, MD   Other Instructions      Brunwald's Heart Disease: A Textbook of Cardiovascular Medicine (11th ed., pp. [phone]). Elsevier.">  Transesophageal Echocardiogram Transesophageal echocardiogram (TEE) is a  test that uses sound waves (echocardiogram) to produce very clear, detailed images of the heart and the arteries that lead to and from the heart. A TEE is done by passing a small ultrasound probe attached to a flexible tube down the mouth, through the throat, and into the esophagus. The heart is located in front of the esophagus. A TEE may be done:  To check how well your heart valves are working.  To check for any abnormal growth or tumor.  To look for blood clots.  To check for infection of the lining of the heart (endocarditis).  To evaluate the dividing wall (septum) of the heart and check for a hole that did not close after birth (patent foramen ovale or atrial septal defect).  To help diagnose a tear in the wall of the blood vessels (aortic dissection).  To look at the heart shape, size, and function. Any changes can be associated with certain conditions, including heart failure, aneurysm, and coronary heart disease (CHD).  During cardiac valve surgery. This allows the surgeon to assess the valve repair before closing the chest.  During a variety of other cardiac procedures to guide positioning of catheters.  To monitor your heart's response to IV fluids or medicine.  To evaluate your heart for a left atrial appendage occluder device. TEE is usually not a painful procedure. You will be given sedation for this procedure. You may feel the probe press against the back of the throat. The probe does not enter the trachea and does not affect your breathing. Tell a health care provider about:  Any allergies you have.  All medicines you are taking, including vitamins, herbs, eye drops, creams, and over-the-counter medicines.  Any problems you or family members have had with anesthetic medicines.  Any blood disorders you have.  Any surgeries you have had.  Any medical conditions you have.  Any swallowing difficulties.  Whether you have or have had a blockage of the esophagus  (esophageal obstruction).  Whether you are pregnant or may be pregnant. What are the risks? Generally, this is a safe procedure. However, problems may occur, including:  Damage to nearby structures or organs.  A tear of the esophagus.  Irregular heartbeat.  Hoarse voice or difficulty swallowing.  Bleeding. What happens before the procedure? Medicines Ask your health care provider about:  Changing or stopping your regular medicines. This is especially important if you are taking diabetes medicines or blood thinners.  Taking medicines such as aspirin and ibuprofen. These medicines can thin your blood. Do not take these medicines unless your health care provider tells you to take them.  Taking over-the-counter medicines, vitamins, herbs, and supplements. General instructions  Follow instructions from your health care provider about eating or drinking restrictions.  You will need to remove any dentures or dental retainers.  Plan to have a responsible adult take you home from the hospital or clinic.  Plan to have a responsible adult care for you for the time you are told after you leave the hospital or clinic. This is important. What  happens during the procedure?  An IV will be inserted into one of your veins.  You will be given one or more of the following: ? A medicine to help you relax (sedative). ? A medicine that is sprayed or gargled to numb the back of your throat (local anesthetic).  Your blood pressure, heart rate, and breathing will be monitored during the procedure.  You may be asked to lie on your left side.  A bite block will be placed in your mouth to keep you from biting the flexible tube during the procedure.  The probe will be placed into the back of your mouth. You will be asked to swallow. This helps to pass the tip of the probe into the esophagus.  Once the tip of the probe is in the correct area, the probe sends sound waves to your heart and collects  the echoes that bounce back. These echoes become pictures that show up on a video monitor.  When the health care provider is finished taking pictures of your heart, the probe and bite block will be removed. The procedure may vary among health care providers and hospitals.   What can I expect after the procedure?  Your blood pressure, heart rate, breathing rate, and blood oxygen level will be monitored until you leave the hospital or clinic.  When you first wake up, your throat may feel slightly sore and will probably still feel numb. This will improve slowly over time. You will not be allowed to eat or drink until the numbness has gone away as you could choke.  It is common to have a sore throat for a day or two after the procedure.  It is up to you to get the results of your procedure. Ask your health care provider, or the department that is doing the procedure, when your results will be ready. Follow these instructions at home:  If you were given a sedative during the procedure, it can affect you for several hours. Do not drive or operate machinery until your health care provider says that it is safe.  Return to your normal activities as told by your health care provider. Ask your health care provider what activities are safe for you.  Keep all follow-up visits. This is important. Summary  Transesophageal echocardiogram (TEE) is a test that uses sound waves (echocardiogram) to produce very clear, detailed images of the heart.  TEE is done by passing a small ultrasound probe attached to a flexible tube down the esophagus.  Generally, this is a safe procedure. However, problems may occur, including damage to nearby structures or organs, bleeding, irregular heartbeat, and a hoarse voice or trouble swallowing.  Tell your health care provider about any medicines you are taking and any medical conditions you have, including swallowing difficulties. This information is not intended to replace  advice given to you by your health care provider. Make sure you discuss any questions you have with your health care provider. Document Revised: 05/24/2020 Document Reviewed: 05/10/2020 Elsevier Patient Education  2021 [employer].

## 2020-12-31 LAB — BASIC METABOLIC PANEL
BUN/Creatinine Ratio: 13 (ref 9–20)
BUN: 11 mg/dL (ref 6–24)
CO2: 22 mmol/L (ref 20–29)
Calcium: 9.7 mg/dL (ref 8.7–10.2)
Chloride: 99 mmol/L (ref 96–106)
Creatinine, Ser: 0.84 mg/dL (ref 0.76–1.27)
Glucose: 103 mg/dL — ABNORMAL HIGH (ref 65–99)
Potassium: 4.7 mmol/L (ref 3.5–5.2)
Sodium: 137 mmol/L (ref 134–144)
eGFR: 100 mL/min/{1.73_m2} (ref >59)

## 2020-12-31 LAB — CBC WITH DIFFERENTIAL/PLATELET
Basophils Absolute: 0.1 10*3/uL (ref 0.0–0.2)
Basos: 1 %
EOS (ABSOLUTE): 0.3 10*3/uL (ref 0.0–0.4)
Eos: 3 %
Hematocrit: 42.3 % (ref 37.5–51.0)
Hemoglobin: 14.9 g/dL (ref 13.0–17.7)
Immature Grans (Abs): 0 10*3/uL (ref 0.0–0.1)
Immature Granulocytes: 0 %
Lymphocytes Absolute: 2.3 10*3/uL (ref 0.7–3.1)
Lymphs: 24 %
MCH: 33.8 pg — ABNORMAL HIGH (ref 26.6–33.0)
MCHC: 35.2 g/dL (ref 31.5–35.7)
MCV: 96 fL (ref 79–97)
Monocytes Absolute: 0.8 10*3/uL (ref 0.1–0.9)
Monocytes: 9 %
Neutrophils Absolute: 6 10*3/uL (ref 1.4–7.0)
Neutrophils: 63 %
Platelets: 328 10*3/uL (ref 150–450)
RBC: 4.41 x10E6/uL (ref 4.14–5.80)
RDW: 11.4 % — ABNORMAL LOW (ref 11.6–15.4)
WBC: 9.5 10*3/uL (ref 3.4–10.8)

## 2021-01-03 ENCOUNTER — Telehealth: Payer: Self-pay | Admitting: Cardiology

## 2021-01-03 NOTE — Telephone Encounter (Signed)
Pt is scheduled for a TEE 01/06/2021, pt has on wife's Apple Watch and it is saying the he is in Sinus Rhythm, Pt wants to be advised if he still should have the TEE on 04/08/20252? Please Advise

## 2021-01-03 NOTE — Telephone Encounter (Signed)
Pl get him in for an EKG!

## 2021-01-04 ENCOUNTER — Ambulatory Visit (INDEPENDENT_AMBULATORY_CARE_PROVIDER_SITE_OTHER): Payer: 59

## 2021-01-04 ENCOUNTER — Other Ambulatory Visit: Payer: Self-pay

## 2021-01-04 ENCOUNTER — Other Ambulatory Visit (HOSPITAL_COMMUNITY): Payer: 59

## 2021-01-04 VITALS — BP 130/84 | HR 73 | Resp 18 | Ht 73.0 in | Wt 198.6 lb

## 2021-01-04 DIAGNOSIS — I4891 Unspecified atrial fibrillation: Secondary | ICD-10-CM | POA: Diagnosis not present

## 2021-01-04 MED ORDER — DILTIAZEM HCL ER COATED BEADS 180 MG PO CP24: 180.0000 mg | ORAL_CAPSULE | Freq: Every morning | ORAL | 3 refills | Status: DC

## 2021-01-04 MED ORDER — AMLODIPINE BESYLATE 10 MG PO TABS: 10.0000 mg | ORAL_TABLET | Freq: Every morning | ORAL | 3 refills | Status: DC

## 2021-01-04 MED ORDER — METOPROLOL SUCCINATE ER 50 MG PO TB24: 50.0000 mg | ORAL_TABLET | Freq: Every morning | ORAL | 3 refills | Status: DC

## 2021-01-04 MED ORDER — APIXABAN 5 MG PO TABS: 5.0000 mg | ORAL_TABLET | Freq: Two times a day (BID) | ORAL | 3 refills | Status: DC

## 2021-01-04 NOTE — Patient Instructions (Signed)
Medication Instructions:  No medication changes. *If you need a refill on your cardiac medications before your next appointment, please call your pharmacy*   Lab Work: None ordered If you have labs (blood work) drawn today and your tests are completely normal, you will receive your results only by: Marland Kitchen MyChart Message (if you have MyChart) OR . A paper copy in the mail If you have any lab test that is abnormal or we need to change your treatment, we will call you to review the results.   Testing/Procedures: None ordered   Follow-Up: At [facility], you and your health needs are our priority.  As part of our continuing mission to provide you with exceptional heart care, we have created designated Provider Care Teams.  These Care Teams include your primary Cardiologist (physician) and Advanced Practice Providers (APPs -  Physician Assistants and Nurse Practitioners) who all work together to provide you with the care you need, when you need it.  We recommend signing up for the patient portal called "MyChart".  Sign up information is provided on this After Visit Summary.  MyChart is used to connect with patients for Virtual Visits (Telemedicine).  Patients are able to view lab/test results, encounter notes, upcoming appointments, etc.  Non-urgent messages can be sent to your provider as well.   To learn more about what you can do with MyChart, go to [email or web address].    Your next appointment:   6 week(s)  The format for your next appointment:   In Person  Provider:   Jyl Heinz, MD   Other Instructions NA

## 2021-01-04 NOTE — Progress Notes (Signed)
1.) Reason for visit: EKG  2.) Name of MD requesting visit: Revankar  3.) H&P: Pt was scheduled for a EKG  4.) ROS related to problem: Pt is scheduled for a cardioversion 01/06/21. The cardioversion will be cancelled per Dr. Geraldo Pitter.  5.) Assessment and plan per MD: per Dr. Geraldo Pitter cancel cardioversion and keep medications the same.

## 2021-01-04 NOTE — Addendum Note (Signed)
Addended by: Truddie Hidden on: 01/04/2021 10:44 AM   Modules accepted: Orders

## 2021-01-06 ENCOUNTER — Encounter (HOSPITAL_COMMUNITY): Payer: Self-pay

## 2021-01-06 ENCOUNTER — Ambulatory Visit (HOSPITAL_COMMUNITY): Admit: 2021-01-06 | Payer: 59 | Admitting: Cardiology

## 2021-01-06 SURGERY — ECHOCARDIOGRAM, TRANSESOPHAGEAL
Anesthesia: Moderate Sedation

## 2021-03-09 ENCOUNTER — Encounter: Payer: Self-pay | Admitting: Cardiology

## 2021-03-09 ENCOUNTER — Ambulatory Visit: Payer: 59 | Admitting: Cardiology

## 2021-03-09 ENCOUNTER — Other Ambulatory Visit: Payer: Self-pay

## 2021-03-09 VITALS — BP 122/70 | HR 68 | Ht 73.0 in | Wt 200.0 lb

## 2021-03-09 DIAGNOSIS — F17210 Nicotine dependence, cigarettes, uncomplicated: Secondary | ICD-10-CM | POA: Diagnosis not present

## 2021-03-09 DIAGNOSIS — I480 Paroxysmal atrial fibrillation: Secondary | ICD-10-CM

## 2021-03-09 DIAGNOSIS — I10 Essential (primary) hypertension: Secondary | ICD-10-CM | POA: Diagnosis not present

## 2021-03-09 HISTORY — DX: Paroxysmal atrial fibrillation: I48.0

## 2021-03-09 NOTE — Progress Notes (Signed)
Cardiology Office Note:    Date:  03/09/2021   ID:  Jermaine Rose, DOB January 15, 1961, MRN [id number]  PCP:  Elenore Paddy, NP  Cardiologist:  Jenean Lindau, MD   Referring MD: Elenore Paddy, NP    ASSESSMENT:    1. Primary hypertension   2. PAF (paroxysmal atrial fibrillation) ([place])   3. Cigarette smoker    PLAN:    In order of problems listed above:  Primary prevention stressed with the patient.  Importance of compliance with diet medication stressed any vocalized understanding.  He was advised to walk on a regular basis. Cigarette smoker: I spent 5 minutes with the patient discussing solely about smoking. Smoking cessation was counseled. I suggested to the patient also different medications and pharmacological interventions. Patient is keen to try stopping on its own at this time. He will get back to me if he needs any further assistance in this matter. Paroxysmal atrial fibrillation:I discussed with the patient atrial fibrillation, disease process. Management and therapy including rate and rhythm control, anticoagulation benefits and potential risks were discussed extensively with the patient. Patient had multiple questions which were answered to patient's satisfaction.  He was advised about anticoagulation.  His CHADS2 score is 1 but I am going to get him a CT calcium scoring to see if he has any significant atherosclerotic vascular disease in which case he might qualify for anticoagulation.  Currently he will continue anticoagulation till that time.  He is agreeable. Essential hypertension: Blood pressure stable and diet was emphasized. Patient will be seen in follow-up appointment in 2 months or earlier if the patient has any concerns   Medication Adjustments/Labs and Tests Ordered: Current medicines are reviewed at length with the patient today.  Concerns regarding medicines are outlined above.  No orders of the defined types were placed in this encounter.  No orders of the  defined types were placed in this encounter.    No chief complaint on file.    History of Present Illness:    Jermaine Rose is a 60 y.o. male.  Patient was evaluated by me for atrial fibrillation.  I planned a TEE and cardioversion but subsequently on anticoagulation patient converted to sinus rhythm.  He is happy about it.  He denies any chest pain orthopnea or PND.  Unfortunately continues to smoke.  He has history of essential hypertension.  At the time of my evaluation, the patient is alert awake oriented and in no distress.  Past Medical History:  Diagnosis Date   Arthritis    Atrial fibrillation ([place])    Bunionette of right foot 05/23/2018   Carpal tunnel syndrome    Cigarette smoker 08/19/2018   Dyspnea on exertion 08/19/2018   External hemorrhoids    Foot pain    Hypertension 08/13/2018   Hyponatremia    MRSA infection    6-8 years ago in his neck per patient   Numbness and tingling in both hands    Osteochondroma    Peroneal tendonitis, right 03/12/2017   Primary osteoarthritis of both first carpometacarpal joints 02/15/2020   Syncope 08/13/2018   had Cath at [place] [facility] but no follow up needed per patient   Unspecified atrial fibrillation ([place]) 12/30/2020   URI (upper respiratory infection) 08/13/2018    Past Surgical History:  Procedure Laterality Date   ANKLE SURGERY Right 2008,2009   CARPAL TUNNEL RELEASE Right 05/02/2020   Procedure: CARPAL TUNNEL RELEASE;  Surgeon: Leanora Cover, MD;  Location: [facility];  Service:  Orthopedics;  Laterality: Right;   CARPAL TUNNEL RELEASE Left 09/27/2020   Procedure: CARPAL TUNNEL RELEASE;  Surgeon: Leanora Cover, MD;  Location: [facility];  Service: Orthopedics;  Laterality: Left;  Bier block   KNEE SURGERY Bilateral    Left in 2007 and Right in 1981    Current Medications: Current Meds  Medication Sig   amLODipine (NORVASC) 10 MG tablet Take 1 tablet (10 mg total) by mouth in the morning.   apixaban  (ELIQUIS) 5 MG TABS tablet Take 1 tablet (5 mg total) by mouth 2 (two) times daily.   diltiazem (CARDIZEM CD) 180 MG 24 hr capsule Take 1 capsule (180 mg total) by mouth in the morning.   indapamide (LOZOL) 2.5 MG tablet Take 2.5 mg by mouth every morning.   metoprolol succinate (TOPROL-XL) 50 MG 24 hr tablet Take 1 tablet (50 mg total) by mouth in the morning.   Multiple Vitamin (MULTIVITAMIN WITH MINERALS) TABS tablet Take 1 tablet by mouth in the morning.   Polyethyl Glycol-Propyl Glycol (LUBRICANT EYE DROPS) 0.4-0.3 % SOLN Place 1-2 drops into both eyes 3 (three) times daily as needed (dry/irritated eyes).   psyllium (METAMUCIL) 58.6 % packet Take 1 packet by mouth in the morning.     Allergies:   Lisinopril   Social History   Socioeconomic History   Marital status: Married    Spouse name: Not on file   Number of children: 1   Years of education: Not on file   Highest education level: High school graduate  Occupational History    Comment: [employer]  Tobacco Use   Smoking status: Every Day    Packs/day: 1.00    Years: 15.00    Pack years: 15.00    Types: Cigarettes   Smokeless tobacco: Never  Vaping Use   Vaping Use: Never used  Substance and Sexual Activity   Alcohol use: Yes    Alcohol/week: 7.0 - 14.0 standard drinks    Types: 7 - 14 Cans of beer per week   Drug use: Not Currently   Sexual activity: Not on file  Other Topics Concern   Not on file  Social History Narrative   Lives with wife   caffeine use heavy   Social Determinants of Health   Financial Resource Strain: Not on file  Food Insecurity: Not on file  Transportation Needs: Not on file  Physical Activity: Not on file  Stress: Not on file  Social Connections: Not on file     Family History: The patient's family history includes Atrial fibrillation in his father and mother; Breast cancer in his mother; CAD in his father; Heart attack in his father; Hypertension in his father.  ROS:   Please see the  history of present illness.    All other systems reviewed and are negative.  EKGs/Labs/Other Studies Reviewed:    The following studies were reviewed today: I discussed my findings with the patient at length.   Recent Labs: 12/30/2020: BUN 11; Creatinine, Ser 0.84; Hemoglobin 14.9; Platelets 328; Potassium 4.7; Sodium 137  Recent Lipid Panel No results found for: CHOL, TRIG, HDL, CHOLHDL, VLDL, LDLCALC, LDLDIRECT  Physical Exam:    VS:  BP 122/70   Pulse 68   Ht 6\' 1"  (1.854 m)   Wt 200 lb (90.7 kg)   SpO2 99%   BMI 26.39 kg/m     Wt Readings from Last 3 Encounters:  03/09/21 200 lb (90.7 kg)  01/04/21 198 lb 9.6 oz (90.1  kg)  12/30/20 198 lb 6.4 oz (90 kg)     GEN: Patient is in no acute distress HEENT: Normal NECK: No JVD; No carotid bruits LYMPHATICS: No lymphadenopathy CARDIAC: Hear sounds regular, 2/6 systolic murmur at the apex. RESPIRATORY:  Clear to auscultation without rales, wheezing or rhonchi  ABDOMEN: Soft, non-tender, non-distended MUSCULOSKELETAL:  No edema; No deformity  SKIN: Warm and dry NEUROLOGIC:  Alert and oriented x 3 PSYCHIATRIC:  Normal affect   Signed, Jenean Lindau, MD  03/09/2021 4:00 PM    [facility]

## 2021-03-09 NOTE — Patient Instructions (Signed)
Medication Instructions:  No medication changes. *If you need a refill on your cardiac medications before your next appointment, please call your pharmacy*   Lab Work: None ordered If you have labs (blood work) drawn today and your tests are completely normal, you will receive your results only by: [place] (if you have MyChart) OR A paper copy in the mail If you have any lab test that is abnormal or we need to change your treatment, we will call you to review the results.   Testing/Procedures:  We will order CT coronary calcium score. It will cost $99.00 and is not covered by insurance.  Please call [phone] to schedule.   [facility]  [address], [place] [address]    Follow-Up: At [facility], you and your health needs are our priority.  As part of our continuing mission to provide you with exceptional heart care, we have created designated Provider Care Teams.  These Care Teams include your primary Cardiologist (physician) and Advanced Practice Providers (APPs -  Physician Assistants and Nurse Practitioners) who all work together to provide you with the care you need, when you need it.  We recommend signing up for the patient portal called "MyChart".  Sign up information is provided on this After Visit Summary.  MyChart is used to connect with patients for Virtual Visits (Telemedicine).  Patients are able to view lab/test results, encounter notes, upcoming appointments, etc.  Non-urgent messages can be sent to your provider as well.   To learn more about what you can do with MyChart, go to [email or web address].    Your next appointment:   2 month(s)  The format for your next appointment:   In Person  Provider:   Jyl Heinz, MD   Other Instructions  Coronary Calcium Scan A coronary calcium scan is an imaging test used to look for deposits of plaque in the inner lining of the blood vessels of the heart (coronary arteries).  Plaque is made up of calcium, protein, and fatty substances. These deposits of plaque can partly clog and narrow the coronary arteries without producing any symptoms or warning signs. This puts a person at risk for a heart attack. This test is recommended for people who are at moderate risk for heart disease. The test can find plaque deposits before symptoms develop. Tell a health care provider about: Any allergies you have. All medicines you are taking, including vitamins, herbs, eye drops, creams, and over-the-counter medicines. Any problems you or family members have had with anesthetic medicines. Any blood disorders you have. Any surgeries you have had. Any medical conditions you have. Whether you are pregnant or may be pregnant. What are the risks? Generally, this is a safe procedure. However, problems may occur, including: Harm to a pregnant woman and her unborn baby. This test involves the use of radiation. Radiation exposure can be dangerous to a pregnant woman and her unborn baby. If you are pregnant or think you may be pregnant, you should not have this procedure done. Slight increase in the risk of cancer. This is because of the radiation involved in the test. What happens before the procedure? Ask your health care provider for any specific instructions on how to prepare for this procedure. You may be asked to avoid products that contain caffeine, tobacco, or nicotine for 4 hours before the procedure. What happens during the procedure? You will undress and remove any jewelry from your neck or chest. You will put on  a hospital gown. Sticky electrodes will be placed on your chest. The electrodes will be connected to an electrocardiogram (ECG) machine to record a tracing of the electrical activity of your heart. You will lie down on a curved bed that is attached to the [place]. You may be given medicine to slow down your heart rate so that clear pictures can be created. You will be  moved into the CT scanner, and the CT scanner will take pictures of your heart. During this time, you will be asked to lie still and hold your breath for 2-3 seconds at a time while each picture of your heart is being taken. The procedure may vary among health care providers and hospitals.    What happens after the procedure? You can get dressed. You can return to your normal activities. It is up to you to get the results of your procedure. Ask your health care provider, or the department that is doing the procedure, when your results will be ready. Summary A coronary calcium scan is an imaging test used to look for deposits of plaque in the inner lining of the blood vessels of the heart (coronary arteries). Plaque is made up of calcium, protein, and fatty substances. Generally, this is a safe procedure. Tell your health care provider if you are pregnant or may be pregnant. Ask your health care provider for any specific instructions on how to prepare for this procedure. A CT scanner will take pictures of your heart. You can return to your normal activities after the scan is done. This information is not intended to replace advice given to you by your health care provider. Make sure you discuss any questions you have with your health care provider. Document Revised: 04/07/2019 Document Reviewed: 04/07/2019 Elsevier Patient Education  [place].

## 2021-04-14 ENCOUNTER — Ambulatory Visit (INDEPENDENT_AMBULATORY_CARE_PROVIDER_SITE_OTHER)
Admission: RE | Admit: 2021-04-14 | Discharge: 2021-04-14 | Disposition: A | Discharge status: Home / Self Care | Payer: Self-pay | Source: Ambulatory Visit | Attending: Cardiology | Admitting: Cardiology

## 2021-04-14 ENCOUNTER — Other Ambulatory Visit: Payer: Self-pay

## 2021-04-14 DIAGNOSIS — I4891 Unspecified atrial fibrillation: Secondary | ICD-10-CM

## 2021-04-14 DIAGNOSIS — I10 Essential (primary) hypertension: Secondary | ICD-10-CM

## 2021-04-21 ENCOUNTER — Other Ambulatory Visit: Payer: Self-pay

## 2021-04-21 MED ORDER — AMLODIPINE BESYLATE 10 MG PO TABS: 10.0000 mg | ORAL_TABLET | Freq: Every morning | ORAL | 3 refills | Status: AC

## 2021-04-21 MED ORDER — DILTIAZEM HCL ER COATED BEADS 180 MG PO CP24: 180.0000 mg | ORAL_CAPSULE | Freq: Every morning | ORAL | 3 refills | Status: DC

## 2021-05-10 ENCOUNTER — Encounter: Payer: Self-pay | Admitting: Cardiology

## 2021-05-10 ENCOUNTER — Ambulatory Visit: Payer: 59 | Admitting: Cardiology

## 2021-05-10 ENCOUNTER — Other Ambulatory Visit: Payer: Self-pay

## 2021-05-10 VITALS — BP 148/84 | HR 76 | Ht 73.0 in | Wt 198.6 lb

## 2021-05-10 DIAGNOSIS — F17210 Nicotine dependence, cigarettes, uncomplicated: Secondary | ICD-10-CM

## 2021-05-10 DIAGNOSIS — I10 Essential (primary) hypertension: Secondary | ICD-10-CM

## 2021-05-10 DIAGNOSIS — I480 Paroxysmal atrial fibrillation: Secondary | ICD-10-CM

## 2021-05-10 DIAGNOSIS — I2510 Atherosclerotic heart disease of native coronary artery without angina pectoris: Secondary | ICD-10-CM

## 2021-05-10 DIAGNOSIS — R0600 Dyspnea, unspecified: Secondary | ICD-10-CM

## 2021-05-10 DIAGNOSIS — R0609 Other forms of dyspnea: Secondary | ICD-10-CM

## 2021-05-10 HISTORY — DX: Atherosclerotic heart disease of native coronary artery without angina pectoris: I25.10

## 2021-05-10 LAB — BASIC METABOLIC PANEL
BUN/Creatinine Ratio: 17 (ref 9–20)
BUN: 14 mg/dL (ref 6–24)
CO2: 24 mmol/L (ref 20–29)
Calcium: 9.4 mg/dL (ref 8.7–10.2)
Chloride: 96 mmol/L (ref 96–106)
Creatinine, Ser: 0.81 mg/dL (ref 0.76–1.27)
Glucose: 101 mg/dL — ABNORMAL HIGH (ref 65–99)
Potassium: 4 mmol/L (ref 3.5–5.2)
Sodium: 136 mmol/L (ref 134–144)
eGFR: 102 mL/min/{1.73_m2} (ref >59)

## 2021-05-10 LAB — HEPATIC FUNCTION PANEL
ALT: 22 IU/L (ref 0–44)
AST: 19 IU/L (ref 0–40)
Albumin: 4.7 g/dL (ref 3.8–4.9)
Alkaline Phosphatase: 81 IU/L (ref 44–121)
Bilirubin Total: 0.4 mg/dL (ref 0.0–1.2)
Bilirubin, Direct: 0.14 mg/dL (ref 0.00–0.40)
Total Protein: 7 g/dL (ref 6.0–8.5)

## 2021-05-10 LAB — LIPID PANEL
Chol/HDL Ratio: 1.8 ratio (ref 0.0–5.0)
Cholesterol, Total: 163 mg/dL (ref 100–199)
HDL: 91 mg/dL (ref >39)
LDL Chol Calc (NIH): 64 mg/dL (ref 0–99)
Triglycerides: 29 mg/dL (ref 0–149)
VLDL Cholesterol Cal: 8 mg/dL (ref 5–40)

## 2021-05-10 MED ORDER — ASPIRIN EC 81 MG PO TBEC: 81.0000 mg | DELAYED_RELEASE_TABLET | Freq: Every day | ORAL | 3 refills | Status: AC

## 2021-05-10 NOTE — Progress Notes (Signed)
Cardiology Office Note:    Date:  05/10/2021   ID:  Jermaine Rose, DOB 11/01/1960, MRN [id number]  PCP:  Elenore Paddy, NP  Cardiologist:  Jenean Lindau, MD   Referring MD: Elenore Paddy, NP    ASSESSMENT:    1. PAF (paroxysmal atrial fibrillation) ([place])   2. Cigarette smoker   3. Dyspnea on exertion   4. Primary hypertension   5. Coronary artery disease involving native coronary artery of native heart without angina pectoris    PLAN:    In order of problems listed above:  Coronary artery disease: Secondary prevention stressed with the patient.  Importance of compliance with diet medication stressed any vocalized understanding.  His coronary calcium score is markedly elevated and therefore we will set him up for a CT coronary angiography with FFR.  I given him options of invasive and noninvasive evaluation and he prefers CT coronary angiography with FFR.  The patient has dyspnea on exertion but this is chronic in view of his significant smoking I would like to assess his coronaries in a more definitive manner.  Based on this finding we will plan further therapy. Essential hypertension: Blood pressure stable and diet was emphasized. Paroxysmal atrial fibrillation: I discussed with the patient atrial fibrillation, disease process. Management and therapy including rate and rhythm control, anticoagulation benefits and potential risks were discussed extensively with the patient. Patient had multiple questions which were answered to patient's satisfaction. In view of his calcium scoring findings we will do blood work today and initiate him on statin therapy.  I discussed benefits and risks and is agreeable. Patient will be seen in follow-up appointment in 1 months or earlier if the patient has any concerns    Medication Adjustments/Labs and Tests Ordered: Current medicines are reviewed at length with the patient today.  Concerns regarding medicines are outlined above.  No orders of the  defined types were placed in this encounter.  No orders of the defined types were placed in this encounter.    No chief complaint on file.    History of Present Illness:    Jermaine Rose is a 60 y.o. male.  Patient has past medical history of paroxysmal atrial fibrillation, essential hypertension and markedly elevated calcium score.  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  He takes care of activities of daily living.  Because of his orthopedic issues involving the foot he does not exercise on a regular basis.  He is a cigarette smoker and unfortunately continues to smoke.  He has some dyspnea on exertion but this has been a chronic issue.  At the time of my evaluation, the patient is alert awake oriented and in no distress.  Past Medical History:  Diagnosis Date   Arthritis    Atrial fibrillation ([place])    Bunionette of right foot 05/23/2018   Carpal tunnel syndrome    Cigarette smoker 08/19/2018   Dyspnea on exertion 08/19/2018   External hemorrhoids    Foot pain    Hypertension 08/13/2018   Hyponatremia    MRSA infection    6-8 years ago in his neck per patient   Numbness and tingling in both hands    Osteochondroma    PAF (paroxysmal atrial fibrillation) ([place]) 03/09/2021   Peroneal tendonitis, right 03/12/2017   Primary osteoarthritis of both first carpometacarpal joints 02/15/2020   Syncope 08/13/2018   had Cath at [facility] but no follow up needed per patient   Unspecified atrial fibrillation ([place])  12/30/2020   URI (upper respiratory infection) 08/13/2018    Past Surgical History:  Procedure Laterality Date   ANKLE SURGERY Right 2008,2009   CARPAL TUNNEL RELEASE Right 05/02/2020   Procedure: CARPAL TUNNEL RELEASE;  Surgeon: Leanora Cover, MD;  Location: [facility];  Service: Orthopedics;  Laterality: Right;   CARPAL TUNNEL RELEASE Left 09/27/2020   Procedure: CARPAL TUNNEL RELEASE;  Surgeon: Leanora Cover, MD;  Location: [facility];   Service: Orthopedics;  Laterality: Left;  Bier block   KNEE SURGERY Bilateral    Left in 2007 and Right in 1981    Current Medications: Current Meds  Medication Sig   amLODipine (NORVASC) 10 MG tablet Take 1 tablet (10 mg total) by mouth in the morning.   apixaban (ELIQUIS) 5 MG TABS tablet Take 1 tablet (5 mg total) by mouth 2 (two) times daily.   diltiazem (CARDIZEM CD) 180 MG 24 hr capsule Take 1 capsule (180 mg total) by mouth in the morning.   indapamide (LOZOL) 2.5 MG tablet Take 2.5 mg by mouth every morning.   metoprolol succinate (TOPROL-XL) 50 MG 24 hr tablet Take 1 tablet (50 mg total) by mouth in the morning.   Multiple Vitamin (MULTIVITAMIN WITH MINERALS) TABS tablet Take 1 tablet by mouth in the morning.   Polyethyl Glycol-Propyl Glycol (LUBRICANT EYE DROPS) 0.4-0.3 % SOLN Place 1-2 drops into both eyes 3 (three) times daily as needed (dry/irritated eyes).   psyllium (METAMUCIL) 58.6 % packet Take 1 packet by mouth in the morning.     Allergies:   Lisinopril   Social History   Socioeconomic History   Marital status: Married    Spouse name: Not on file   Number of children: 1   Years of education: Not on file   Highest education level: High school graduate  Occupational History    Comment: [employer]  Tobacco Use   Smoking status: Every Day    Packs/day: 1.00    Years: 15.00    Pack years: 15.00    Types: Cigarettes   Smokeless tobacco: Never  Vaping Use   Vaping Use: Never used  Substance and Sexual Activity   Alcohol use: Yes    Alcohol/week: 7.0 - 14.0 standard drinks    Types: 7 - 14 Cans of beer per week   Drug use: Not Currently   Sexual activity: Not on file  Other Topics Concern   Not on file  Social History Narrative   Lives with wife   caffeine use heavy   Social Determinants of Health   Financial Resource Strain: Not on file  Food Insecurity: Not on file  Transportation Needs: Not on file  Physical Activity: Not on file  Stress: Not on file   Social Connections: Not on file     Family History: The patient's family history includes Atrial fibrillation in his father and mother; Breast cancer in his mother; CAD in his father; Heart attack in his father; Hypertension in his father.  ROS:   Please see the history of present illness.    All other systems reviewed and are negative.  EKGs/Labs/Other Studies Reviewed:    The following studies were reviewed today: 04/14/2021 10:28   CLINICAL DATA:  Cardiovascular Disease Risk stratification 60 year old male   EXAM: Coronary Calcium Score   TECHNIQUE: A gated, non-contrast computed tomography scan of the heart was performed using 48m slice thickness. Axial images were analyzed on a dedicated workstation. Calcium scoring of the coronary  arteries was performed using the Agatston method.   FINDINGS: Coronary arteries: Normal origins.   Coronary Calcium Score:   Left main: 32   Left anterior descending artery: 393   Left circumflex artery: 257   Right coronary artery: 901   Total: 1582   Percentile: 99   Pericardium: Normal.   Ascending Aorta: Normal caliber.  Mild atherosclerosis.   Non-cardiac: See separate report from [facility].   IMPRESSION: Coronary calcium score of 1582. This was 73 percentile for age-, race-, and sex-matched controls.   RECOMMENDATIONS: Coronary artery calcium (CAC) score is a strong predictor of incident coronary heart disease (CHD) and provides predictive information beyond traditional risk factors. CAC scoring is reasonable to use in the decision to withhold, postpone, or initiate statin therapy in intermediate-risk or selected borderline-risk asymptomatic adults (age 77-75 years and LDL-C >=70 to <190 mg/dL) who do not have diabetes or established atherosclerotic cardiovascular disease (ASCVD).* In intermediate-risk (10-year ASCVD risk >=7.5% to <20%) adults or selected borderline-risk (10-year ASCVD risk >=5% to  <7.5%) adults in whom a CAC score is measured for the purpose of making a treatment decision the following recommendations have been made:   If CAC=0, it is reasonable to withhold statin therapy and reassess in 5 to 10 years, as long as higher risk conditions are absent (diabetes mellitus, family history of premature CHD in first degree relatives (males <55 years; females <65 years), cigarette smoking, or LDL >=190 mg/dL).   If CAC is 1 to 99, it is reasonable to initiate statin therapy for patients >=109 years of age.   If CAC is >=100 or >=75th percentile, it is reasonable to initiate statin therapy at any age.   Cardiology referral should be considered for patients with CAC scores >=400 or >=75th percentile.   *2018 AHA/ACC/AACVPR/AAPA/ABC/ACPM/ADA/AGS/APhA/ASPC/NLA/PCNA Guideline on the Management of Blood Cholesterol: A Report of the American College of Cardiology/American Heart Association Task Force on Clinical Practice Guidelines. J Am Coll Cardiol. 2019;73(24):3168-3209.     Electronically Signed   By: Candee Furbish MD   On: 04/14/2021 10:28    Addended by Jerline Pain, MD on 04/14/2021 10:30 AM    Study Result  Narrative & Impression  EXAM: OVER-READ INTERPRETATION  CT CHEST   The following report is an over-read performed by radiologist Dr. Vinnie Langton of [facility], [place] on 04/14/2021. This over-read does not include interpretation of cardiac or coronary anatomy or pathology. The coronary calcium score interpretation by the cardiologist is attached.   COMPARISON:  None.   FINDINGS: Atherosclerotic calcifications in the thoracic aorta. Within the visualized portions of the thorax there are no suspicious appearing pulmonary nodules or masses, there is no acute consolidative airspace disease, no pleural effusions, no pneumothorax and no lymphadenopathy. Visualized portions of the upper abdomen are unremarkable. There are no aggressive appearing  lytic or blastic lesions noted in the visualized portions of the skeleton.   IMPRESSION: 1.  Aortic Atherosclerosis (ICD10-I70.0).   Electronically Signed: By: Vinnie Langton M.D. On: 04/14/2021 07:50     Recent Labs: 12/30/2020: BUN 11; Creatinine, Ser 0.84; Hemoglobin 14.9; Platelets 328; Potassium 4.7; Sodium 137  Recent Lipid Panel No results found for: CHOL, TRIG, HDL, CHOLHDL, VLDL, LDLCALC, LDLDIRECT  Physical Exam:    VS:  BP (!) 148/84   Pulse 76   Ht '6\' 1"'$  (1.854 m)   Wt 198 lb 9.6 oz (90.1 kg)   SpO2 97%   BMI 26.20 kg/m     Wt Readings  from Last 3 Encounters:  05/10/21 198 lb 9.6 oz (90.1 kg)  03/09/21 200 lb (90.7 kg)  01/04/21 198 lb 9.6 oz (90.1 kg)     GEN: Patient is in no acute distress HEENT: Normal NECK: No JVD; No carotid bruits LYMPHATICS: No lymphadenopathy CARDIAC: Hear sounds regular, 2/6 systolic murmur at the apex. RESPIRATORY:  Clear to auscultation without rales, wheezing or rhonchi  ABDOMEN: Soft, non-tender, non-distended MUSCULOSKELETAL:  No edema; No deformity  SKIN: Warm and dry NEUROLOGIC:  Alert and oriented x 3 PSYCHIATRIC:  Normal affect   Signed, Jenean Lindau, MD  05/10/2021 10:32 AM    [place]

## 2021-05-10 NOTE — Patient Instructions (Signed)
Medication Instructions:  Your physician has recommended you make the following change in your medication:   Start a 81 mg coated aspirin daily.  *If you need a refill on your cardiac medications before your next appointment, please call your pharmacy*   Lab Work: Your physician recommends that you have labs done in the office today. Your test included  basic metabolic panel, liver function and lipids.  If you have labs (blood work) drawn today and your tests are completely normal, you will receive your results only by: [place] (if you have MyChart) OR A paper copy in the mail If you have any lab test that is abnormal or we need to change your treatment, we will call you to review the results.   Testing/Procedures: Your cardiac CT will be scheduled at:   [facility] [place], [place] [address] [phone]   If scheduled at [facility], please arrive at the [facility] main entrance of [facility] 30 minutes prior to test start time. Proceed to the [facility] Radiology Department (first floor) to check-in and test prep.  Please follow these instructions carefully (unless otherwise directed):  Hold all erectile dysfunction medications at least 3 days (72 hrs) prior to test.  On the Night Before the Test: Be sure to Drink plenty of water. Do not consume any caffeinated/decaffeinated beverages or chocolate 12 hours prior to your test. Do not take any antihistamines 12 hours prior to your test.  On the Day of the Test: Drink plenty of water. Do not drink any water within one hour of the test. Do not eat any food 4 hours prior to the test. You may take your regular medications prior to the test.  Take metoprolol (Toprol XL)  total of 100 mg two hours prior to test.       After the Test: Drink plenty of water. After receiving IV contrast, you may experience a mild flushed feeling. This is normal. On occasion, you may  experience a mild rash up to 24 hours after the test. This is not dangerous. If this occurs, you can take Benadryl 25 mg and increase your fluid intake. If you experience trouble breathing, this can be serious. If it is severe call 911 IMMEDIATELY. If it is mild, please call our office. If you take any of these medications: Glipizide/Metformin, Avandament, Glucavance, please do not take 48 hours after completing test unless otherwise instructed.   Once we have confirmed authorization from your insurance company, we will call you to set up a date and time for your test. Based on how quickly your insurance processes prior authorizations requests, please allow up to 4 weeks to be contacted for scheduling your Cardiac CT appointment. Be advised that routine Cardiac CT appointments could be scheduled as many as 8 weeks after your provider has ordered it.  For non-scheduling related questions, please contact the cardiac imaging nurse navigator should you have any questions/concerns: Marchia Bond, Cardiac Imaging Nurse Navigator Burley Saver, Interim Cardiac Imaging Nurse [place] [facility] Direct Office Dial: [phone]   For scheduling needs, including cancellations and rescheduling, please call Vivien Rota at [phone].     Follow-Up: At [facility], you and your health needs are our priority.  As part of our continuing mission to provide you with exceptional heart care, we have created designated Provider Care Teams.  These Care Teams include your primary Cardiologist (physician) and Advanced Practice Providers (APPs -  Physician  Assistants and Nurse Practitioners) who all work together to provide you with the care you need, when you need it.  We recommend signing up for the patient portal called "MyChart".  Sign up information is provided on this After Visit Summary.  MyChart is used to connect with patients for Virtual Visits (Telemedicine).  Patients are able to view  lab/test results, encounter notes, upcoming appointments, etc.  Non-urgent messages can be sent to your provider as well.   To learn more about what you can do with MyChart, go to [email or web address].    Your next appointment:   2 month(s)  The format for your next appointment:   In Person  Provider:   Jyl Heinz, MD   Other Instructions Cardiac CT Angiogram A cardiac CT angiogram is a procedure to look at the heart and the area around the heart. It may be done to help find the cause of chest pains or other symptoms of heart disease. During this procedure, a substance called contrast dye is injected into the blood vessels in the area to be checked. A large X-ray machine, called a CT scanner, then takes detailed pictures of the heart and the surrounding area. The procedure is also sometimes called a coronary CT angiogram, coronary artery scanning, or CTA. A cardiac CT angiogram allows the health care provider to see how well blood is flowing to and from the heart. The health care provider will be able to see if there are any problems, such as: Blockage or narrowing of the coronary arteries in the heart. Fluid around the heart. Signs of weakness or disease in the muscles, valves, and tissues of the heart. Tell a health care provider about: Any allergies you have. This is especially important if you have had a previous allergic reaction to contrast dye. All medicines you are taking, including vitamins, herbs, eye drops, creams, and over-the-counter medicines. Any blood disorders you have. Any surgeries you have had. Any medical conditions you have. Whether you are pregnant or may be pregnant. Any anxiety disorders, chronic pain, or other conditions you have that may increase your stress or prevent you from lying still. What are the risks? Generally, this is a safe procedure. However, problems may occur, including: Bleeding. Infection. Allergic reactions to medicines or  dyes. Damage to other structures or organs. Kidney damage from the contrast dye that is used. Increased risk of cancer from radiation exposure. This risk is low. Talk with your health care provider about: The risks and benefits of testing. How you can receive the lowest dose of radiation. What happens before the procedure? Wear comfortable clothing and remove any jewelry, glasses, dentures, and hearing aids. Follow instructions from your health care provider about eating and drinking. This may include: For 12 hours before the procedure -- avoid caffeine. This includes tea, coffee, soda, energy drinks, and diet pills. Drink plenty of water or other fluids that do not have caffeine in them. Being well hydrated can prevent complications. For 4-6 hours before the procedure -- stop eating and drinking. The contrast dye can cause nausea, but this is less likely if your stomach is empty. Ask your health care provider about changing or stopping your regular medicines. This is especially important if you are taking diabetes medicines, blood thinners, or medicines to treat problems with erections (erectile dysfunction). What happens during the procedure?  Hair on your chest may need to be removed so that small sticky patches called electrodes can be placed on your chest. These will transmit  information that helps to monitor your heart during the procedure. An IV will be inserted into one of your veins. You might be given a medicine to control your heart rate during the procedure. This will help to ensure that good images are obtained. You will be asked to lie on an exam table. This table will slide in and out of the CT machine during the procedure. Contrast dye will be injected into the IV. You might feel warm, or you may get a metallic taste in your mouth. You will be given a medicine called nitroglycerin. This will relax or dilate the arteries in your heart. The table that you are lying on will move into  the CT machine tunnel for the scan. The person running the machine will give you instructions while the scans are being done. You may be asked to: Keep your arms above your head. Hold your breath. Stay very still, even if the table is moving. When the scanning is complete, you will be moved out of the machine. The IV will be removed. The procedure may vary among health care providers and hospitals. What can I expect after the procedure? After your procedure, it is common to have: A metallic taste in your mouth from the contrast dye. A feeling of warmth. A headache from the nitroglycerin. Follow these instructions at home: Take over-the-counter and prescription medicines only as told by your health care provider. If you are told, drink enough fluid to keep your urine pale yellow. This will help to flush the contrast dye out of your body. Most people can return to their normal activities right after the procedure. Ask your health care provider what activities are safe for you. It is up to you to get the results of your procedure. Ask your health care provider, or the department that is doing the procedure, when your results will be ready. Keep all follow-up visits as told by your health care provider. This is important. Contact a health care provider if: You have any symptoms of allergy to the contrast dye. These include: Shortness of breath. Rash or hives. A racing heartbeat. Summary A cardiac CT angiogram is a procedure to look at the heart and the area around the heart. It may be done to help find the cause of chest pains or other symptoms of heart disease. During this procedure, a large X-ray machine, called a CT scanner, takes detailed pictures of the heart and the surrounding area after a contrast dye has been injected into blood vessels in the area. Ask your health care provider about changing or stopping your regular medicines before the procedure. This is especially important if you  are taking diabetes medicines, blood thinners, or medicines to treat erectile dysfunction. If you are told, drink enough fluid to keep your urine pale yellow. This will help to flush the contrast dye out of your body. This information is not intended to replace advice given to you by your health care provider. Make sure you discuss any questions you have with your health care provider. Document Revised: 05/13/2019 Document Reviewed: 05/13/2019 Elsevier Patient Education  [place].

## 2021-05-18 ENCOUNTER — Telehealth (HOSPITAL_COMMUNITY): Payer: Self-pay | Admitting: Emergency Medicine

## 2021-05-18 NOTE — Telephone Encounter (Signed)
Attempted to call patient regarding upcoming cardiac CT appointment. °Left message on voicemail with name and callback number °Caily Rakers RN Navigator Cardiac Imaging °[facility] °[phone] Office °[phone] Cell ° °

## 2021-05-19 ENCOUNTER — Telehealth (HOSPITAL_COMMUNITY): Payer: Self-pay | Admitting: Emergency Medicine

## 2021-05-19 NOTE — Telephone Encounter (Signed)
Pt returning phone call regarding upcoming cardiac imaging study; pt verbalizes understanding of appt date/time, parking situation and where to check in, pre-test NPO status and medications ordered, and verified current allergies; name and call back number provided for further questions should they arise Marchia Bond RN Navigator Cardiac Imaging [facility] [phone] office [phone] cell   Pt to take double dose '50mg'$  metoprolol succ ('100mg'$  total) 2 hr prior to scan Denies claustro Denies iv issues

## 2021-05-19 NOTE — Telephone Encounter (Signed)
Attempted to call patient regarding upcoming cardiac CT appointment. °Left message on voicemail with name and callback number °Berkley Wrightsman RN Navigator Cardiac Imaging °[facility] °[phone] Office °[phone] Cell ° °

## 2021-05-22 ENCOUNTER — Ambulatory Visit (HOSPITAL_COMMUNITY)
Admission: RE | Admit: 2021-05-22 | Discharge: 2021-05-22 | Disposition: A | Discharge status: Home / Self Care | Payer: 59 | Source: Ambulatory Visit | Attending: Cardiology | Admitting: Cardiology

## 2021-05-22 ENCOUNTER — Other Ambulatory Visit: Payer: Self-pay

## 2021-05-22 ENCOUNTER — Encounter (HOSPITAL_COMMUNITY): Payer: Self-pay

## 2021-05-22 ENCOUNTER — Other Ambulatory Visit (HOSPITAL_COMMUNITY): Payer: Self-pay | Admitting: Emergency Medicine

## 2021-05-22 DIAGNOSIS — R079 Chest pain, unspecified: Secondary | ICD-10-CM | POA: Insufficient documentation

## 2021-05-22 DIAGNOSIS — R0609 Other forms of dyspnea: Secondary | ICD-10-CM

## 2021-05-22 DIAGNOSIS — R0600 Dyspnea, unspecified: Secondary | ICD-10-CM | POA: Insufficient documentation

## 2021-05-22 DIAGNOSIS — I2510 Atherosclerotic heart disease of native coronary artery without angina pectoris: Secondary | ICD-10-CM | POA: Insufficient documentation

## 2021-05-22 DIAGNOSIS — R931 Abnormal findings on diagnostic imaging of heart and coronary circulation: Secondary | ICD-10-CM

## 2021-05-22 MED ORDER — NITROGLYCERIN 0.4 MG SL SUBL
SUBLINGUAL_TABLET | SUBLINGUAL | Status: AC
  Filled 2021-05-22: qty 2

## 2021-05-22 MED ORDER — IOHEXOL 350 MG/ML SOLN
100.0000 mL | Freq: Once | INTRAVENOUS | Status: AC | PRN
  Administered 2021-05-22: 95 mL via INTRAVENOUS

## 2021-05-22 MED ORDER — NITROGLYCERIN 0.4 MG SL SUBL
0.8000 mg | SUBLINGUAL_TABLET | Freq: Once | SUBLINGUAL | Status: AC
  Administered 2021-05-22: 0.8 mg via SUBLINGUAL

## 2021-05-23 MED ORDER — NITROGLYCERIN 0.4 MG SL SUBL: 0.4000 mg | SUBLINGUAL_TABLET | SUBLINGUAL | 6 refills | Status: DC | PRN

## 2021-05-23 NOTE — Addendum Note (Signed)
Addended by: Truddie Hidden on: 05/23/2021 08:22 AM   Modules accepted: Orders

## 2021-05-24 DIAGNOSIS — I2510 Atherosclerotic heart disease of native coronary artery without angina pectoris: Secondary | ICD-10-CM | POA: Diagnosis not present

## 2021-05-25 ENCOUNTER — Other Ambulatory Visit: Payer: Self-pay

## 2021-05-29 ENCOUNTER — Other Ambulatory Visit: Payer: Self-pay

## 2021-05-29 ENCOUNTER — Encounter: Payer: Self-pay | Admitting: Cardiology

## 2021-05-29 ENCOUNTER — Ambulatory Visit: Payer: 59 | Admitting: Cardiology

## 2021-05-29 VITALS — BP 130/74 | HR 81 | Ht 73.0 in | Wt 202.4 lb

## 2021-05-29 DIAGNOSIS — F17210 Nicotine dependence, cigarettes, uncomplicated: Secondary | ICD-10-CM | POA: Diagnosis not present

## 2021-05-29 DIAGNOSIS — I10 Essential (primary) hypertension: Secondary | ICD-10-CM | POA: Diagnosis not present

## 2021-05-29 DIAGNOSIS — R931 Abnormal findings on diagnostic imaging of heart and coronary circulation: Secondary | ICD-10-CM

## 2021-05-29 DIAGNOSIS — I480 Paroxysmal atrial fibrillation: Secondary | ICD-10-CM | POA: Diagnosis not present

## 2021-05-29 DIAGNOSIS — I2510 Atherosclerotic heart disease of native coronary artery without angina pectoris: Secondary | ICD-10-CM | POA: Diagnosis not present

## 2021-05-29 MED ORDER — TADALAFIL 20 MG PO TABS
20.0000 mg | ORAL_TABLET | Freq: Every day | ORAL | 0 refills | Status: DC | PRN
Start: 2021-05-29 — End: 2021-07-03

## 2021-05-29 MED ORDER — ROSUVASTATIN CALCIUM 10 MG PO TABS: 10.0000 mg | ORAL_TABLET | Freq: Every day | ORAL | 3 refills | Status: DC

## 2021-05-29 NOTE — Progress Notes (Addendum)
Cardiology Office Note:    Date:  05/29/2021   ID:  Jermaine Rose, DOB 1961-08-17, MRN [id number]  PCP:  Jermaine Paddy, NP  Cardiologist:  Jermaine Lindau, MD   Referring MD: Jermaine Paddy, NP    ASSESSMENT:    1. Coronary artery disease involving native coronary artery of native heart without angina pectoris   2. Primary hypertension   3. PAF (paroxysmal atrial fibrillation) ([place])   4. Cigarette smoker    PLAN:    In order of problems listed above:  Coronary artery disease: Secondary prevention stressed to the patient.  Importance of compliance with diet medication stressed any vocalized understanding.  I mentioned to patient that she has mostly nonobstructive disease except diagonal branch of the LAD.  I given the option of coronary angiography but because of the fact that he has hardly any symptoms he prefers medical management and expectations. Essential hypertension: Blood pressure stable and diet was emphasized. Because of coronary artery disease and guideline directed medical therapy I have started him on rosuvastatin 10 mg daily.  Lipids were done recently and I reviewed them from the [facility] sheet and they are fine.  He will be back in 6 weeks for liver lipid check.  Diet was emphasized.  Importance of regular exercise stressed. Cigarette smoker: I spent 5 minutes with the patient discussing solely about smoking. Smoking cessation was counseled. I suggested to the patient also different medications and pharmacological interventions. Patient is keen to try stopping on its own at this time. He will get back to me if he needs any further assistance in this matter. Patient will be seen in follow-up appointment in 1 month or earlier if the patient has any concerns Patient mentions to me that he wants some medicine for erectile dysfunction.  He has good effort tolerance and with this he has no chest pain.  I prescribed Cialis for him.  Risks of interactions with nitroglycerin was  described and he understands.  His wife was with him today.  I mentioned to them that there should be a 48-hour window of using nitroglycerin or Cialis and both medicine should not be used together in a 48-hour window and they understand and there have been told about this by the primary care provider also.    Medication Adjustments/Labs and Tests Ordered: Current medicines are reviewed at length with the patient today.  Concerns regarding medicines are outlined above.  No orders of the defined types were placed in this encounter.  No orders of the defined types were placed in this encounter.    No chief complaint on file.    History of Present Illness:    Jermaine Rose is a 60 y.o. male.  Patient has past medical history of paroxysmal atrial fibrillation, essential hypertension and cigarette smoking.  He denies any problems at this time and takes care of activities of daily living.  No chest pain orthopnea or PND.  He tells me that he walks on a regular basis.  His CT scanning for coronary artery disease was abnormal and the details are mentioned below.  Past Medical History:  Diagnosis Date   Arthritis    Atrial fibrillation ([place])    Bunionette of right foot 05/23/2018   Carpal tunnel syndrome    Cigarette smoker 08/19/2018   Coronary artery disease 05/10/2021   Dyspnea on exertion 08/19/2018   External hemorrhoids    Foot pain    Hypertension 08/13/2018   Hyponatremia    MRSA infection  6-8 years ago in his neck per patient   Numbness and tingling in both hands    Osteochondroma    PAF (paroxysmal atrial fibrillation) ([place]) 03/09/2021   Peroneal tendonitis, right 03/12/2017   Primary osteoarthritis of both first carpometacarpal joints 02/15/2020   Syncope 08/13/2018   had Cath at [place] but no follow up needed per patient   Unspecified atrial fibrillation ([place]) 12/30/2020   URI (upper respiratory infection) 08/13/2018    Past Surgical History:  Procedure Laterality Date    ANKLE SURGERY Right 2008,2009   CARPAL TUNNEL RELEASE Right 05/02/2020   Procedure: CARPAL TUNNEL RELEASE;  Surgeon: Jermaine Cover, MD;  Location: [place];  Service: Orthopedics;  Laterality: Right;   CARPAL TUNNEL RELEASE Left 09/27/2020   Procedure: CARPAL TUNNEL RELEASE;  Surgeon: Jermaine Cover, MD;  Location: [facility];  Service: Orthopedics;  Laterality: Left;  Bier block   KNEE SURGERY Bilateral    Left in 2007 and Right in 1981    Current Medications: No outpatient medications have been marked as taking for the 05/29/21 encounter (Office Visit) with Jermaine Rose, Jermaine Cliche, MD.     Allergies:   Lisinopril   Social History   Socioeconomic History   Marital status: Married    Spouse name: Not on file   Number of children: 1   Years of education: Not on file   Highest education level: High school graduate  Occupational History    Comment: [employer]  Tobacco Use   Smoking status: Every Day    Packs/day: 1.00    Years: 15.00    Pack years: 15.00    Types: Cigarettes   Smokeless tobacco: Never  Vaping Use   Vaping Use: Never used  Substance and Sexual Activity   Alcohol use: Yes    Alcohol/week: 7.0 - 14.0 standard drinks    Types: 7 - 14 Cans of beer per week   Drug use: Not Currently   Sexual activity: Not on file  Other Topics Concern   Not on file  Social History Narrative   Lives with wife   caffeine use heavy   Social Determinants of Health   Financial Resource Strain: Not on file  Food Insecurity: Not on file  Transportation Needs: Not on file  Physical Activity: Not on file  Stress: Not on file  Social Connections: Not on file     Family History: The patient's family history includes Atrial fibrillation in his father and mother; Breast cancer in his mother; CAD in his father; Heart attack in his father; Hypertension in his father.  ROS:   Please see the history of present illness.    All other systems reviewed and are  negative.  EKGs/Labs/Other Studies Reviewed:    The following studies were reviewed today: CT FFR ANALYSIS   FINDINGS: CT FFR analysis was performed on the original cardiac computed tomography angiogram dataset. Diagrammatic representation of the CT FFR analysis is provided in a separate PDF document in PACS. This dictation was created using the PDF document and an interactive 3D model of the results. The 3D model is not available in the EMR/PACS. Normal CT FFR range is >0.80.   1. Left Main: No significant stenosis.   2. LAD: 0.86 mid-distal LAD.  0.75 mid D2 3. LCX: 0.9 distal LCx. 4. RCA: 0.92 mid-distal RCA.   IMPRESSION: 1. There does not appear to be hemodynamically significant stenosis in the LAD, LCx, or RCA.   2. There is a 2nd  diagonal that appears to have hemodynamically significant proximal vessel stenosis.   Loralie Champagne, MD   MEDICATIONS: No additional medications     Electronically Signed   By: Loralie Champagne M.D.   On: 05/24/2021 16:25   Recent Labs: 12/30/2020: Hemoglobin 14.9; Platelets 328 05/10/2021: ALT 22; BUN 14; Creatinine, Ser 0.81; Potassium 4.0; Sodium 136  Recent Lipid Panel    Component Value Date/Time   CHOL 163 05/10/2021 1042   TRIG 29 05/10/2021 1042   HDL 91 05/10/2021 1042   CHOLHDL 1.8 05/10/2021 1042   LDLCALC 64 05/10/2021 1042    Physical Exam:    VS:  BP 130/74   Pulse 81   Ht '6\' 1"'$  (1.854 m)   Wt 202 lb 6.4 oz (91.8 kg)   SpO2 98%   BMI 26.70 kg/m     Wt Readings from Last 3 Encounters:  05/29/21 202 lb 6.4 oz (91.8 kg)  05/10/21 198 lb 9.6 oz (90.1 kg)  03/09/21 200 lb (90.7 kg)     GEN: Patient is in no acute distress HEENT: Normal NECK: No JVD; No carotid bruits LYMPHATICS: No lymphadenopathy CARDIAC: Hear sounds regular, 2/6 systolic murmur at the apex. RESPIRATORY:  Clear to auscultation without rales, wheezing or rhonchi  ABDOMEN: Soft, non-tender, non-distended MUSCULOSKELETAL:  No edema; No  deformity  SKIN: Warm and dry NEUROLOGIC:  Alert and oriented x 3 PSYCHIATRIC:  Normal affect   Signed, Jermaine Lindau, MD  05/29/2021 1:15 PM    [facility]

## 2021-05-29 NOTE — Patient Instructions (Signed)
Medication Instructions:  Your physician has recommended you make the following change in your medication:   Start Rosuvastatin 10 mg daily.  Use Cialis 20 mg as directed. Do not use within 72 hours of Nitroglycerin.  *If you need a refill on your cardiac medications before your next appointment, please call your pharmacy*   Lab Work: Your physician recommends that you have a BMET and LFT today.  Your physician recommends that you return for lab work in: 6 weeks You need to have labs done when you are fasting.  You can come Monday through Friday 8:30 am to 12:00 pm and 1:15 to 4:30. You do not need to make an appointment as the order has already been placed. The labs you are going to have done are BMET, LFT and Lipids.   If you have labs (blood work) drawn today and your tests are completely normal, you will receive your results only by: [place] (if you have MyChart) OR A paper copy in the mail If you have any lab test that is abnormal or we need to change your treatment, we will call you to review the results.   Testing/Procedures: None ordered   Follow-Up: At [facility], you and your health needs are our priority.  As part of our continuing mission to provide you with exceptional heart care, we have created designated Provider Care Teams.  These Care Teams include your primary Cardiologist (physician) and Advanced Practice Providers (APPs -  Physician Assistants and Nurse Practitioners) who all work together to provide you with the care you need, when you need it.  We recommend signing up for the patient portal called "MyChart".  Sign up information is provided on this After Visit Summary.  MyChart is used to connect with patients for Virtual Visits (Telemedicine).  Patients are able to view lab/test results, encounter notes, upcoming appointments, etc.  Non-urgent messages can be sent to your provider as well.   To learn more about what you can do with MyChart, go to  [email or web address].    Your next appointment:   1 month   The format for your next appointment:   In Person  Provider:   Jyl Heinz, MD   Other Instructions NA

## 2021-05-30 LAB — BASIC METABOLIC PANEL
BUN/Creatinine Ratio: 12 (ref 10–24)
BUN: 10 mg/dL (ref 8–27)
CO2: 26 mmol/L (ref 20–29)
Calcium: 9.8 mg/dL (ref 8.6–10.2)
Chloride: 94 mmol/L — ABNORMAL LOW (ref 96–106)
Creatinine, Ser: 0.86 mg/dL (ref 0.76–1.27)
Glucose: 64 mg/dL — ABNORMAL LOW (ref 65–99)
Potassium: 3.9 mmol/L (ref 3.5–5.2)
Sodium: 133 mmol/L — ABNORMAL LOW (ref 134–144)
eGFR: 99 mL/min/{1.73_m2} (ref >59)

## 2021-05-30 LAB — LIPID PANEL
Chol/HDL Ratio: 1.7 ratio (ref 0.0–5.0)
Cholesterol, Total: 158 mg/dL (ref 100–199)
HDL: 92 mg/dL (ref >39)
LDL Chol Calc (NIH): 54 mg/dL (ref 0–99)
Triglycerides: 59 mg/dL (ref 0–149)
VLDL Cholesterol Cal: 12 mg/dL (ref 5–40)

## 2021-06-01 LAB — HEPATIC FUNCTION PANEL
ALT: 16 IU/L (ref 0–44)
AST: 16 IU/L (ref 0–40)
Albumin: 5.2 g/dL — ABNORMAL HIGH (ref 3.8–4.9)
Alkaline Phosphatase: 74 IU/L (ref 44–121)
Bilirubin Total: 0.2 mg/dL (ref 0.0–1.2)
Bilirubin, Direct: <0.1 mg/dL (ref 0.00–0.40)
Total Protein: 7.4 g/dL (ref 6.0–8.5)

## 2021-06-01 LAB — SPECIMEN STATUS REPORT

## 2021-07-03 ENCOUNTER — Other Ambulatory Visit: Payer: Self-pay

## 2021-07-03 ENCOUNTER — Ambulatory Visit: Payer: 59 | Admitting: Cardiology

## 2021-07-03 ENCOUNTER — Encounter: Payer: Self-pay | Admitting: Cardiology

## 2021-07-03 VITALS — BP 148/78 | HR 82 | Ht 73.0 in | Wt 202.4 lb

## 2021-07-03 DIAGNOSIS — I2510 Atherosclerotic heart disease of native coronary artery without angina pectoris: Secondary | ICD-10-CM | POA: Diagnosis not present

## 2021-07-03 DIAGNOSIS — F17210 Nicotine dependence, cigarettes, uncomplicated: Secondary | ICD-10-CM

## 2021-07-03 DIAGNOSIS — I10 Essential (primary) hypertension: Secondary | ICD-10-CM

## 2021-07-03 DIAGNOSIS — I480 Paroxysmal atrial fibrillation: Secondary | ICD-10-CM | POA: Diagnosis not present

## 2021-07-03 MED ORDER — TADALAFIL 20 MG PO TABS: 20.0000 mg | ORAL_TABLET | Freq: Every day | ORAL | 0 refills | Status: DC | PRN

## 2021-07-03 NOTE — Patient Instructions (Signed)
Medication Instructions:  No medication changes. *If you need a refill on your cardiac medications before your next appointment, please call your pharmacy*   Lab Work: Your physician recommends that you return for lab work in: 6 weeks You need to have labs done when you are fasting.  You can come Monday through Friday 8:30 am to 12:00 pm and 1:15 to 4:30. You do not need to make an appointment as the order has already been placed. The labs you are going to have done are BMET,  LFT and Lipids.  If you have labs (blood work) drawn today and your tests are completely normal, you will receive your results only by: [place] (if you have MyChart) OR A paper copy in the mail If you have any lab test that is abnormal or we need to change your treatment, we will call you to review the results.   Testing/Procedures: None ordered   Follow-Up: At [facility], you and your health needs are our priority.  As part of our continuing mission to provide you with exceptional heart care, we have created designated Provider Care Teams.  These Care Teams include your primary Cardiologist (physician) and Advanced Practice Providers (APPs -  Physician Assistants and Nurse Practitioners) who all work together to provide you with the care you need, when you need it.  We recommend signing up for the patient portal called "MyChart".  Sign up information is provided on this After Visit Summary.  MyChart is used to connect with patients for Virtual Visits (Telemedicine).  Patients are able to view lab/test results, encounter notes, upcoming appointments, etc.  Non-urgent messages can be sent to your provider as well.   To learn more about what you can do with MyChart, go to [email or web address].    Your next appointment:   6 month(s)  The format for your next appointment:   In Person  Provider:   Jyl Heinz, MD   Other Instructions NA

## 2021-07-03 NOTE — Progress Notes (Signed)
Cardiology Office Note:    Date:  07/03/2021   ID:  [name], DOB 11/23/1960, MRN [id number]  PCP:  Elenore Paddy, NP  Cardiologist:  Jenean Lindau, MD   Referring MD: Elenore Paddy, NP    ASSESSMENT:    1. PAF (paroxysmal atrial fibrillation) ([place])   2. Coronary artery disease involving native coronary artery of native heart without angina pectoris   3. Primary hypertension   4. Cigarette smoker    PLAN:    In order of problems listed above:  Coronary artery disease: Secondary prevention stressed with the patient.  Importance of compliance with diet medication stressed any vocalized understanding.  He was advised to walk at least half an hour a day 5 days a week and he promises to do so. Paroxysmal atrial fibrillation:I discussed with the patient atrial fibrillation, disease process. Management and therapy including rate and rhythm control, anticoagulation benefits and potential risks were discussed extensively with the patient. Patient had multiple questions which were answered to patient's satisfaction. Essential hypertension: Blood pressure stable and diet was emphasized.  Lifestyle modification urged.  He has an element of whitecoat hypertension. Mixed dyslipidemia: On statin therapy.  He will continue his medications.  Diet emphasized.  He will be back in 6 weeks for liver lipid check. Cigarette smoker: I spent 5 minutes with the patient discussing solely about smoking. Smoking cessation was counseled. I suggested to the patient also different medications and pharmacological interventions. Patient is keen to try stopping on its own at this time. He will get back to me if he needs any further assistance in this matter. Patient will be seen in follow-up appointment in 6 months or earlier if the patient has any concerns    Medication Adjustments/Labs and Tests Ordered: Current medicines are reviewed at length with the patient today.  Concerns regarding medicines are  outlined above.  No orders of the defined types were placed in this encounter.  No orders of the defined types were placed in this encounter.    No chief complaint on file.    History of Present Illness:    Jermaine Rose is a 60 y.o. male.  Patient has history of abnormal stress test.  He has history of essential hypertension dyslipidemia and unfortunately continues to smoke.  He has coronary artery disease.  He denies any problems at this time and takes care of activities of daily living.  No chest pain orthopnea or PND.  At the time of my evaluation, the patient is alert awake oriented and in no distress.  Past Medical History:  Diagnosis Date   Arthritis    Atrial fibrillation ([place])    Bunionette of right foot 05/23/2018   Carpal tunnel syndrome    Cigarette smoker 08/19/2018   Coronary artery disease 05/10/2021   Dyspnea on exertion 08/19/2018   External hemorrhoids    Foot pain    Hypertension 08/13/2018   Hyponatremia    MRSA infection    6-8 years ago in his neck per patient   Numbness and tingling in both hands    Osteochondroma    PAF (paroxysmal atrial fibrillation) ([place]) 03/09/2021   Peroneal tendonitis, right 03/12/2017   Primary osteoarthritis of both first carpometacarpal joints 02/15/2020   Syncope 08/13/2018   had Cath at [facility] but no follow up needed per patient   Unspecified atrial fibrillation ([place]) 12/30/2020   URI (upper respiratory infection) 08/13/2018    Past Surgical History:  Procedure Laterality Date   ANKLE  SURGERY Right 2008,2009   CARPAL TUNNEL RELEASE Right 05/02/2020   Procedure: CARPAL TUNNEL RELEASE;  Surgeon: Leanora Cover, MD;  Location: [facility];  Service: Orthopedics;  Laterality: Right;   CARPAL TUNNEL RELEASE Left 09/27/2020   Procedure: CARPAL TUNNEL RELEASE;  Surgeon: Leanora Cover, MD;  Location: [facility];  Service: Orthopedics;  Laterality: Left;  Bier block   KNEE SURGERY Bilateral    Left in 2007 and  Right in 1981    Current Medications: Current Meds  Medication Sig   amLODipine (NORVASC) 10 MG tablet Take 1 tablet (10 mg total) by mouth in the morning.   apixaban (ELIQUIS) 5 MG TABS tablet Take 1 tablet (5 mg total) by mouth 2 (two) times daily.   aspirin EC 81 MG tablet Take 1 tablet (81 mg total) by mouth daily. Swallow whole.   diltiazem (CARDIZEM CD) 180 MG 24 hr capsule Take 1 capsule (180 mg total) by mouth in the morning.   indapamide (LOZOL) 2.5 MG tablet Take 2.5 mg by mouth every morning.   metoprolol succinate (TOPROL-XL) 50 MG 24 hr tablet Take 1 tablet (50 mg total) by mouth in the morning.   Multiple Vitamin (MULTIVITAMIN WITH MINERALS) TABS tablet Take 1 tablet by mouth in the morning.   nitroGLYCERIN (NITROSTAT) 0.4 MG SL tablet Place 0.4 mg under the tongue every 5 (five) minutes as needed for chest pain.   Polyethyl Glycol-Propyl Glycol (LUBRICANT EYE DROPS) 0.4-0.3 % SOLN Place 1-2 drops into both eyes 3 (three) times daily as needed for dry eyes (dry/irritated eyes).   psyllium (METAMUCIL) 58.6 % packet Take 1 packet by mouth in the morning.   rosuvastatin (CRESTOR) 10 MG tablet Take 1 tablet (10 mg total) by mouth daily.   tadalafil (CIALIS) 20 MG tablet Take 1 tablet (20 mg total) by mouth daily as needed for erectile dysfunction.     Allergies:   Lisinopril   Social History   Socioeconomic History   Marital status: Married    Spouse name: Not on file   Number of children: 1   Years of education: Not on file   Highest education level: High school graduate  Occupational History    Comment: [employer]  Tobacco Use   Smoking status: Every Day    Packs/day: 1.00    Years: 15.00    Pack years: 15.00    Types: Cigarettes   Smokeless tobacco: Never  Vaping Use   Vaping Use: Never used  Substance and Sexual Activity   Alcohol use: Yes    Alcohol/week: 7.0 - 14.0 standard drinks    Types: 7 - 14 Cans of beer per week   Drug use: Not Currently   Sexual  activity: Not on file  Other Topics Concern   Not on file  Social History Narrative   Lives with wife   caffeine use heavy   Social Determinants of Health   Financial Resource Strain: Not on file  Food Insecurity: Not on file  Transportation Needs: Not on file  Physical Activity: Not on file  Stress: Not on file  Social Connections: Not on file     Family History: The patient's family history includes Atrial fibrillation in his father and mother; Breast cancer in his mother; CAD in his father; Heart attack in his father; Hypertension in his father.  ROS:   Please see the history of present illness.    All other systems reviewed and are negative.  EKGs/Labs/Other Studies Reviewed:  The following studies were reviewed today: I discussed my findings with the patient at length.   Recent Labs: 12/30/2020: Hemoglobin 14.9; Platelets 328 05/29/2021: ALT 16; BUN 10; Creatinine, Ser 0.86; Potassium 3.9; Sodium 133  Recent Lipid Panel    Component Value Date/Time   CHOL 158 05/29/2021 1342   TRIG 59 05/29/2021 1342   HDL 92 05/29/2021 1342   CHOLHDL 1.7 05/29/2021 1342   LDLCALC 54 05/29/2021 1342    Physical Exam:    VS:  BP (!) 148/78   Pulse 82   Ht 6\' 1"  (1.854 m)   Wt 202 lb 6.4 oz (91.8 kg)   SpO2 99%   BMI 26.70 kg/m     Wt Readings from Last 3 Encounters:  07/03/21 202 lb 6.4 oz (91.8 kg)  05/29/21 202 lb 6.4 oz (91.8 kg)  05/10/21 198 lb 9.6 oz (90.1 kg)     GEN: Patient is in no acute distress HEENT: Normal NECK: No JVD; No carotid bruits LYMPHATICS: No lymphadenopathy CARDIAC: Hear sounds regular, 2/6 systolic murmur at the apex. RESPIRATORY:  Clear to auscultation without rales, wheezing or rhonchi  ABDOMEN: Soft, non-tender, non-distended MUSCULOSKELETAL:  No edema; No deformity  SKIN: Warm and dry NEUROLOGIC:  Alert and oriented x 3 PSYCHIATRIC:  Normal affect   Signed, Jenean Lindau, MD  07/03/2021 9:05 AM    [place]

## 2021-08-14 ENCOUNTER — Other Ambulatory Visit: Payer: Self-pay | Admitting: Cardiology

## 2021-08-14 NOTE — Telephone Encounter (Signed)
Prescription refill request for Eliquis received. Indication: afib  Last office visit: Revankar 07/03/2021 Scr: 0.86, 05/29/2021 Age:  60 yo  Weight: 91.8 kg   Refill sent.

## 2021-08-15 LAB — LIPID PANEL
Chol/HDL Ratio: 1.6 ratio (ref 0.0–5.0)
Cholesterol, Total: 143 mg/dL (ref 100–199)
HDL: 90 mg/dL (ref >39)
LDL Chol Calc (NIH): 44 mg/dL (ref 0–99)
Triglycerides: 32 mg/dL (ref 0–149)
VLDL Cholesterol Cal: 9 mg/dL (ref 5–40)

## 2021-08-15 LAB — HEPATIC FUNCTION PANEL
ALT: 24 IU/L (ref 0–44)
AST: 23 IU/L (ref 0–40)
Albumin: 4.9 g/dL (ref 3.8–4.9)
Alkaline Phosphatase: 79 IU/L (ref 44–121)
Bilirubin Total: 0.4 mg/dL (ref 0.0–1.2)
Bilirubin, Direct: 0.14 mg/dL (ref 0.00–0.40)
Total Protein: 7.2 g/dL (ref 6.0–8.5)

## 2021-08-15 LAB — BASIC METABOLIC PANEL
BUN/Creatinine Ratio: 15 (ref 10–24)
BUN: 12 mg/dL (ref 8–27)
CO2: 24 mmol/L (ref 20–29)
Calcium: 9.5 mg/dL (ref 8.6–10.2)
Chloride: 92 mmol/L — ABNORMAL LOW (ref 96–106)
Creatinine, Ser: 0.82 mg/dL (ref 0.76–1.27)
Glucose: 106 mg/dL — ABNORMAL HIGH (ref 70–99)
Potassium: 4.2 mmol/L (ref 3.5–5.2)
Sodium: 131 mmol/L — ABNORMAL LOW (ref 134–144)
eGFR: 101 mL/min/{1.73_m2} (ref >59)

## 2022-01-11 ENCOUNTER — Other Ambulatory Visit: Payer: Self-pay | Admitting: Cardiology

## 2022-02-19 ENCOUNTER — Ambulatory Visit: Payer: 59 | Admitting: Cardiology

## 2022-02-19 ENCOUNTER — Encounter: Payer: Self-pay | Admitting: Cardiology

## 2022-02-19 VITALS — BP 126/66 | HR 78 | Ht 79.2 in | Wt 196.6 lb

## 2022-02-19 DIAGNOSIS — I2510 Atherosclerotic heart disease of native coronary artery without angina pectoris: Secondary | ICD-10-CM

## 2022-02-19 DIAGNOSIS — F17210 Nicotine dependence, cigarettes, uncomplicated: Secondary | ICD-10-CM | POA: Diagnosis not present

## 2022-02-19 DIAGNOSIS — I10 Essential (primary) hypertension: Secondary | ICD-10-CM

## 2022-02-19 DIAGNOSIS — I480 Paroxysmal atrial fibrillation: Secondary | ICD-10-CM | POA: Diagnosis not present

## 2022-02-19 NOTE — Patient Instructions (Signed)
Medication Instructions:  Your physician recommends that you continue on your current medications as directed. Please refer to the Current Medication list given to you today.  *If you need a refill on your cardiac medications before your next appointment, please call your pharmacy*   Lab Work: None If you have labs (blood work) drawn today and your tests are completely normal, you will receive your results only by: MyChart Message (if you have MyChart) OR A paper copy in the mail If you have any lab test that is abnormal or we need to change your treatment, we will call you to review the results.   Testing/Procedures: None   Follow-Up: At [facility], you and your health needs are our priority.  As part of our continuing mission to provide you with exceptional heart care, we have created designated Provider Care Teams.  These Care Teams include your primary Cardiologist (physician) and Advanced Practice Providers (APPs -  Physician Assistants and Nurse Practitioners) who all work together to provide you with the care you need, when you need it.  We recommend signing up for the patient portal called "MyChart".  Sign up information is provided on this After Visit Summary.  MyChart is used to connect with patients for Virtual Visits (Telemedicine).  Patients are able to view lab/test results, encounter notes, upcoming appointments, etc.  Non-urgent messages can be sent to your provider as well.   To learn more about what you can do with MyChart, go to [email or web address].    Your next appointment:   9 month(s)  The format for your next appointment:   In Person  Provider:   Rajan Revankar, MD.   Other Instructions None  Important Information About Sugar       

## 2022-02-19 NOTE — Progress Notes (Signed)
Cardiology Office Note:    Date:  02/19/2022   ID:  Jermaine Rose, DOB 1961-01-31, MRN [id number]  PCP:  Elenore Paddy, NP  Cardiologist:  Jenean Lindau, MD   Referring MD: Elenore Paddy, NP    ASSESSMENT:    1. Coronary artery disease involving native coronary artery of native heart without angina pectoris   2. PAF (paroxysmal atrial fibrillation) ([place])   3. Cigarette smoker   4. Primary hypertension    PLAN:    In order of problems listed above:  Coronary artery disease: Secondary prevention stressed with patient.  Importance of compliance with diet and medication stressed any vocalized understanding.  He was advised to walk at least half an hour a day 5 days a week and he promises to do so. Essential hypertension: Blood pressure stable and diet was emphasized. Mixed dyslipidemia: Lipids were reviewed and they are fine.  Diet was emphasized. Paroxysmal atrial fibrillation:I discussed with the patient atrial fibrillation, disease process. Management and therapy including rate and rhythm control, anticoagulation benefits and potential risks were discussed extensively with the patient. Patient had multiple questions which were answered to patient's satisfaction. Cigarette smoker: I spent 5 minutes with the patient discussing solely about smoking. Smoking cessation was counseled. I suggested to the patient also different medications and pharmacological interventions. Patient is keen to try stopping on its own at this time. He will get back to me if he needs any further assistance in this matter. Patient will be seen in follow-up appointment in 6 months or earlier if the patient has any concerns    Medication Adjustments/Labs and Tests Ordered: Current medicines are reviewed at length with the patient today.  Concerns regarding medicines are outlined above.  No orders of the defined types were placed in this encounter.  No orders of the defined types were placed in this  encounter.    No chief complaint on file.    History of Present Illness:    Jermaine Rose is a 61 y.o. male.  Patient has past medical history of paroxysmal atrial fibrillation, essential hypertension, dyslipidemia and coronary artery disease.  He denies any problems at this time and takes care of activities of daily living.  No chest pain orthopnea or PND.  At the time of my evaluation, the patient is alert awake oriented and in no distress.  He ambulates without any symptoms.  Unfortunately he continues to smoke though he has cut down significantly smoking and uses the gum for nicotine.  Past Medical History:  Diagnosis Date   Arthritis    Atrial fibrillation ([place])    Bunionette of right foot 05/23/2018   Carpal tunnel syndrome    Cigarette smoker 08/19/2018   Coronary artery disease 05/10/2021   Dyspnea on exertion 08/19/2018   External hemorrhoids    Foot pain    Hypertension 08/13/2018   Hyponatremia    MRSA infection    6-8 years ago in his neck per patient   Numbness and tingling in both hands    Osteochondroma    PAF (paroxysmal atrial fibrillation) ([place]) 03/09/2021   Peroneal tendonitis, right 03/12/2017   Primary osteoarthritis of both first carpometacarpal joints 02/15/2020   Syncope 08/13/2018   had Cath at [facility] but no follow up needed per patient   Unspecified atrial fibrillation ([place]) 12/30/2020   URI (upper respiratory infection) 08/13/2018    Past Surgical History:  Procedure Laterality Date   ANKLE SURGERY Right 2008,2009   CARPAL TUNNEL RELEASE Right 05/02/2020  Procedure: CARPAL TUNNEL RELEASE;  Surgeon: Leanora Cover, MD;  Location: [facility];  Service: Orthopedics;  Laterality: Right;   CARPAL TUNNEL RELEASE Left 09/27/2020   Procedure: CARPAL TUNNEL RELEASE;  Surgeon: Leanora Cover, MD;  Location: [facility];  Service: Orthopedics;  Laterality: Left;  Bier block   KNEE SURGERY Bilateral    Left in 2007 and Right in 1981     Current Medications: Current Meds  Medication Sig   amLODipine (NORVASC) 10 MG tablet Take 1 tablet (10 mg total) by mouth in the morning.   apixaban (ELIQUIS) 5 MG TABS tablet TAKE ONE TABLET BY MOUTH TWICE DAILY   aspirin EC 81 MG tablet Take 1 tablet (81 mg total) by mouth daily. Swallow whole.   diltiazem (CARDIZEM CD) 180 MG 24 hr capsule Take 1 capsule (180 mg total) by mouth in the morning.   indapamide (LOZOL) 2.5 MG tablet Take 2.5 mg by mouth every morning.   metoprolol succinate (TOPROL-XL) 50 MG 24 hr tablet Take 1 tablet (50 mg total) by mouth in the morning.   Multiple Vitamin (MULTIVITAMIN WITH MINERALS) TABS tablet Take 1 tablet by mouth in the morning.   nitroGLYCERIN (NITROSTAT) 0.4 MG SL tablet Place 0.4 mg under the tongue every 5 (five) minutes as needed for chest pain.   Polyethyl Glycol-Propyl Glycol (LUBRICANT EYE DROPS) 0.4-0.3 % SOLN Place 1-2 drops into both eyes 3 (three) times daily as needed for dry eyes (dry/irritated eyes).   psyllium (METAMUCIL) 58.6 % packet Take 1 packet by mouth in the morning.   rosuvastatin (CRESTOR) 10 MG tablet Take 10 mg by mouth daily.   tadalafil (CIALIS) 20 MG tablet Take 1 tablet (20 mg total) by mouth daily as needed for erectile dysfunction.     Allergies:   Lisinopril   Social History   Socioeconomic History   Marital status: Married    Spouse name: Not on file   Number of children: 1   Years of education: Not on file   Highest education level: High school graduate  Occupational History    Comment: [employer]  Tobacco Use   Smoking status: Every Day    Packs/day: 1.00    Years: 15.00    Pack years: 15.00    Types: Cigarettes   Smokeless tobacco: Never  Vaping Use   Vaping Use: Never used  Substance and Sexual Activity   Alcohol use: Yes    Alcohol/week: 7.0 - 14.0 standard drinks    Types: 7 - 14 Cans of beer per week   Drug use: Not Currently   Sexual activity: Not on file  Other Topics Concern   Not on  file  Social History Narrative   Lives with wife   caffeine use heavy   Social Determinants of Health   Financial Resource Strain: Not on file  Food Insecurity: Not on file  Transportation Needs: Not on file  Physical Activity: Not on file  Stress: Not on file  Social Connections: Not on file     Family History: The patient's family history includes Atrial fibrillation in his father and mother; Breast cancer in his mother; CAD in his father; Heart attack in his father; Hypertension in his father.  ROS:   Please see the history of present illness.    All other systems reviewed and are negative.  EKGs/Labs/Other Studies Reviewed:    The following studies were reviewed today: I discussed my findings with the patient at length.   Recent  Labs: 08/14/2021: ALT 24; BUN 12; Creatinine, Ser 0.82; Potassium 4.2; Sodium 131  Recent Lipid Panel    Component Value Date/Time   CHOL 143 08/14/2021 0828   TRIG 32 08/14/2021 0828   HDL 90 08/14/2021 0828   CHOLHDL 1.6 08/14/2021 0828   LDLCALC 44 08/14/2021 0828    Physical Exam:    VS:  BP 126/66   Pulse 78   Ht 6' 7.2" (2.012 m)   Wt 196 lb 10.1 oz (89.2 kg)   SpO2 97%   BMI 22.04 kg/m     Wt Readings from Last 3 Encounters:  02/19/22 196 lb 10.1 oz (89.2 kg)  07/03/21 202 lb 6.4 oz (91.8 kg)  05/29/21 202 lb 6.4 oz (91.8 kg)     GEN: Patient is in no acute distress HEENT: Normal NECK: No JVD; No carotid bruits LYMPHATICS: No lymphadenopathy CARDIAC: Hear sounds regular, 2/6 systolic murmur at the apex. RESPIRATORY:  Clear to auscultation without rales, wheezing or rhonchi  ABDOMEN: Soft, non-tender, non-distended MUSCULOSKELETAL:  No edema; No deformity  SKIN: Warm and dry NEUROLOGIC:  Alert and oriented x 3 PSYCHIATRIC:  Normal affect   Signed, Jenean Lindau, MD  02/19/2022 8:17 AM    [place]

## 2022-06-11 ENCOUNTER — Other Ambulatory Visit: Payer: Self-pay | Admitting: Cardiology

## 2022-11-15 IMAGING — CT CT CARDIAC CORONARY ARTERY CALCIUM SCORE
3 series · 14 of 20 positions shown, 15 images · non-contrast
Comparison: None.
COMPARISON: None.

Addendum:
EXAM:
OVER-READ INTERPRETATION  CT CHEST

The following report is an over-read performed by radiologist Dr.
Adele Montana [REDACTED] on 04/14/2021. This
over-read does not include interpretation of cardiac or coronary
anatomy or pathology. The coronary calcium score interpretation by
the cardiologist is attached.
CLINICAL DATA: Cardiovascular Disease Risk stratification 59 year
old male
Coronary Calcium Score
TECHNIQUE: A gated, non-contrast computed tomography scan of the heart was
performed using 3mm slice thickness. Axial images were analyzed on a
dedicated workstation. Calcium scoring of the coronary arteries was
performed using the Agatston method.

[Series 2: casc 3.0 bv41 2 bestdiast 70 % · axial · 0.50mm/px · z∈[+1294,+1368]mm · 4 of 43 slices shown, 5 images]
[im 9/43  vessel]
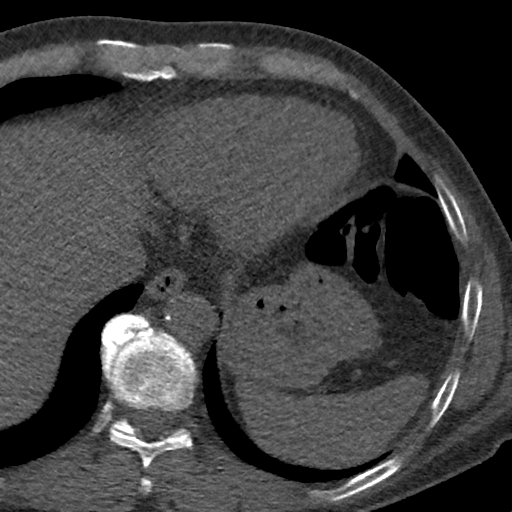
[im 9/43  lung]
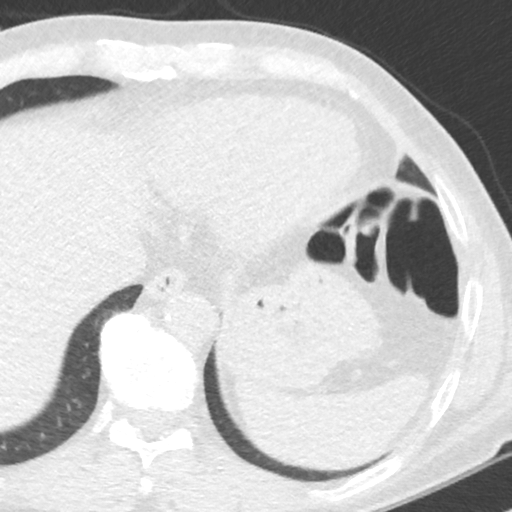
[im 17/43  vessel]
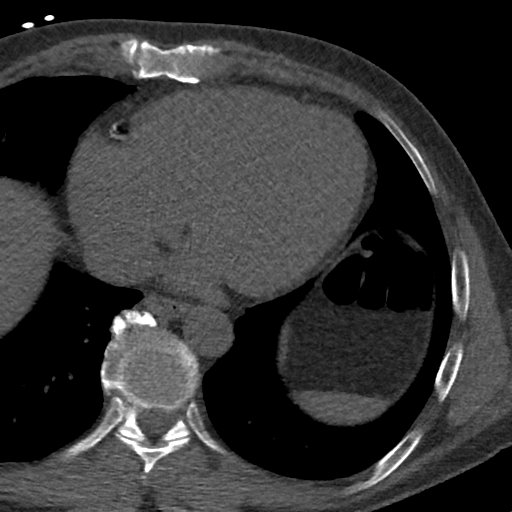
[im 26/43  vessel]
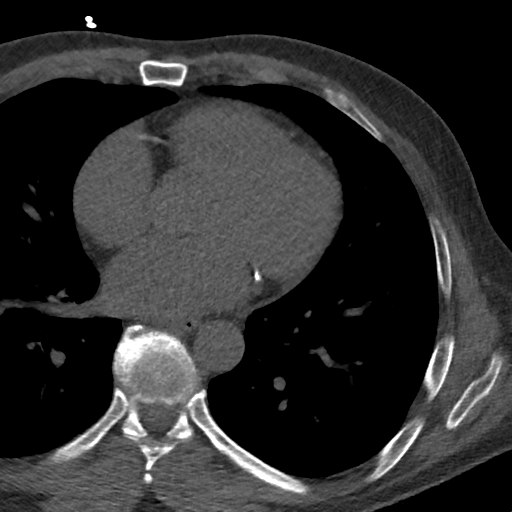
[im 34/43  vessel]
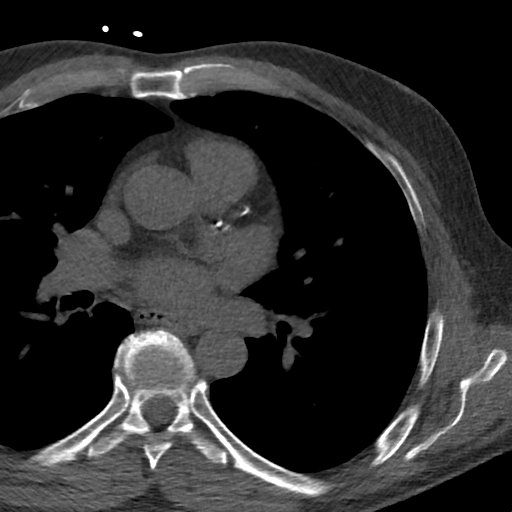

[Series 3: lung 70 % · axial · 0.68mm/px · z∈[+1290,+1374]mm · 5 of 43 slices shown]
[im 8/43  lung]
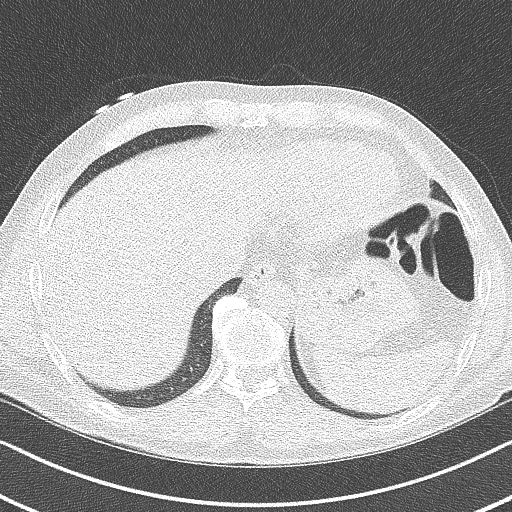
[im 15/43  lung]
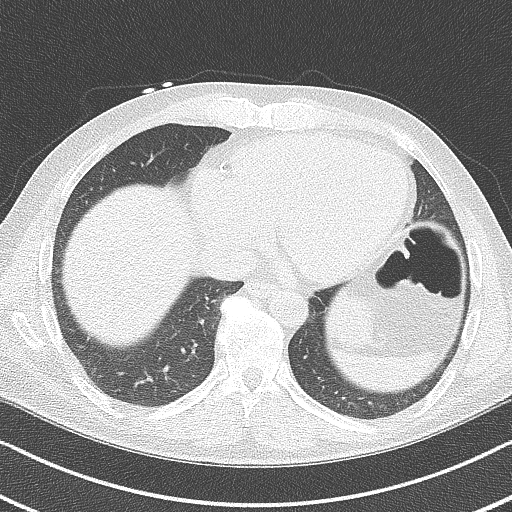
[im 22/43  lung]
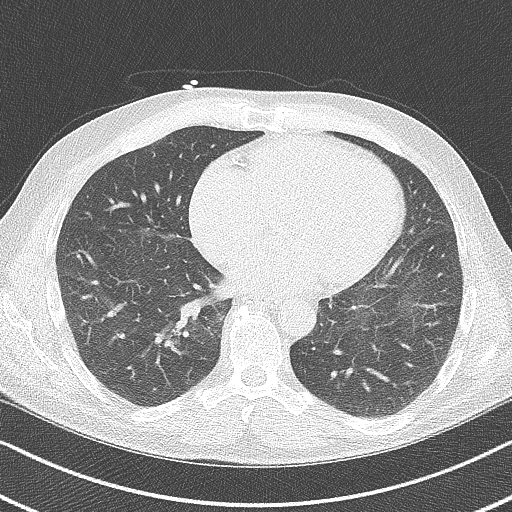
[im 29/43  lung]
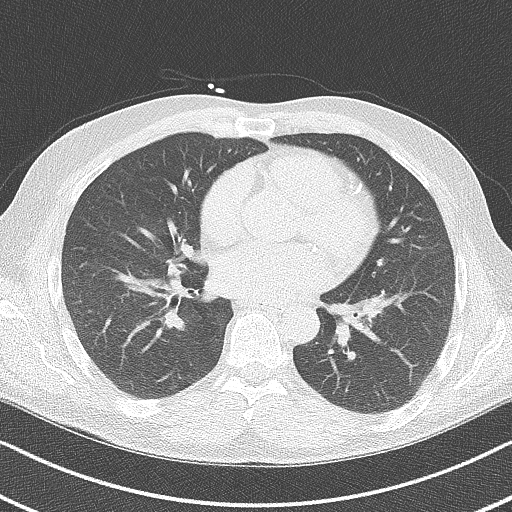
[im 36/43  lung]
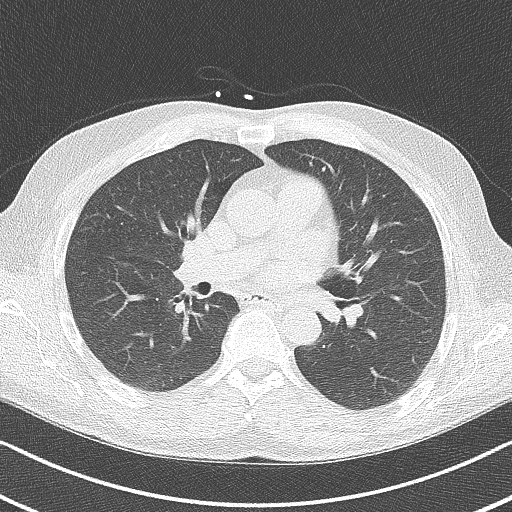

[Series 4: lung st 70 % · axial · 0.68mm/px · z∈[+1290,+1374]mm · 5 of 43 slices shown]
[im 8/43  lung]
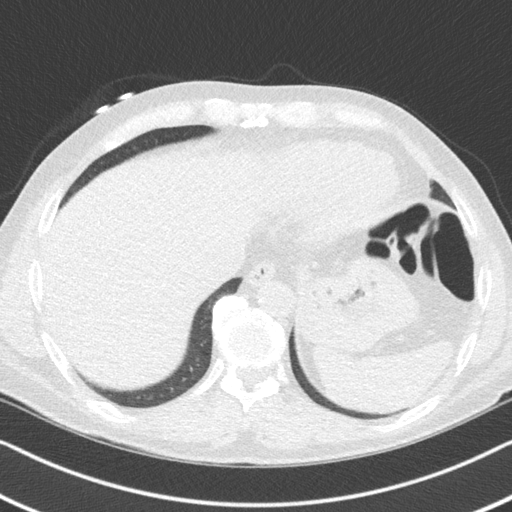
[im 15/43  lung]
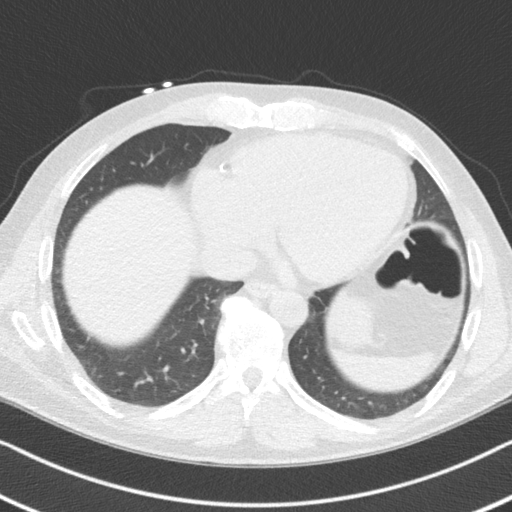
[im 22/43  lung]
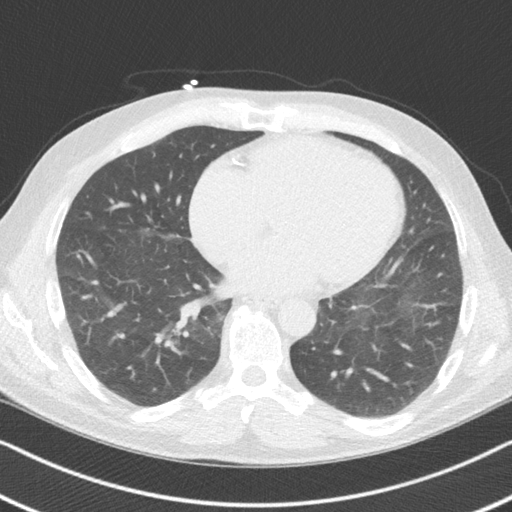
[im 29/43  lung]
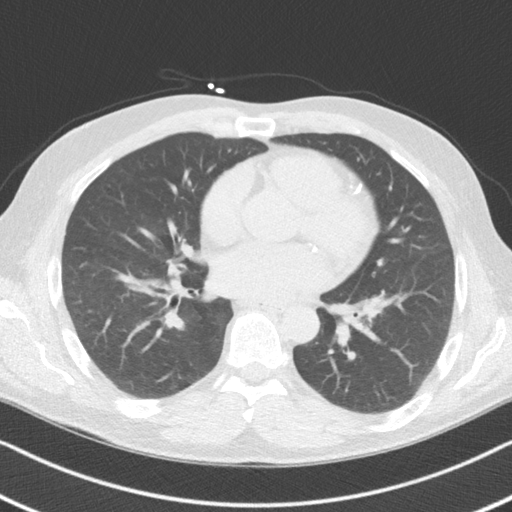
[im 36/43  lung]
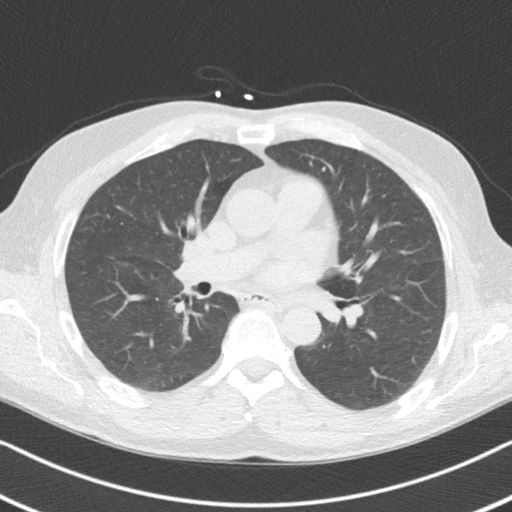

[14 of 20 positions shown; findings below may reference images not displayed]

FINDINGS: Atherosclerotic calcifications in the thoracic aorta. Within the
visualized portions of the thorax there are no suspicious appearing
pulmonary nodules or masses, there is no acute consolidative
airspace disease, no pleural effusions, no pneumothorax and no
lymphadenopathy. Visualized portions of the upper abdomen are
unremarkable. There are no aggressive appearing lytic or blastic
lesions noted in the visualized portions of the skeleton.
IMPRESSION: 1.  Aortic Atherosclerosis (0198X-VVM.M).
FINDINGS: Coronary arteries: Normal origins.

Coronary Calcium Score:

Left main: 32

Left anterior descending artery: 393

Left circumflex artery: 257

Right coronary artery: 901

Total: 8938

Percentile: 99

Pericardium: Normal.

Ascending Aorta: Normal caliber.  Mild atherosclerosis.

Non-cardiac: See separate report from [REDACTED].
IMPRESSION: Coronary calcium score of 8938. This was 99 percentile for age-,
race-, and sex-matched controls.



If CAC=0, it is reasonable to withhold statin therapy and reassess
in 5 to 10 years, as long as higher risk conditions are absent
(diabetes mellitus, family history of premature CHD in first degree
relatives (males <55 years; females <65 years), cigarette smoking,
or LDL >=190 mg/dL).

If CAC is 1 to 99, it is reasonable to initiate statin therapy for
patients >=55 years of age.

If CAC is >=100 or >=75th percentile, it is reasonable to initiate
statin therapy at any age.

Cardiology referral should be considered for patients with CAC
scores >=400 or >=75th percentile.

*7663 AHA/ACC/AACVPR/AAPA/ABC/WITTMAN/AFROZA/BAKKER/[place]/KIRILL/ARISSA/PFISTER
Guideline on the Management of Blood Cholesterol: A Report of the
American College of Cardiology/American Heart Association Task Force
on Clinical Practice Guidelines. J Am Coll Cardiol.
4046;73(24):6821-6380.

*** End of Addendum ***
EXAM:
OVER-READ INTERPRETATION  CT CHEST

The following report is an over-read performed by radiologist Dr.
Adele Montana [REDACTED] on 04/14/2021. This
over-read does not include interpretation of cardiac or coronary
anatomy or pathology. The coronary calcium score interpretation by
the cardiologist is attached.
FINDINGS: Atherosclerotic calcifications in the thoracic aorta. Within the
visualized portions of the thorax there are no suspicious appearing
pulmonary nodules or masses, there is no acute consolidative
airspace disease, no pleural effusions, no pneumothorax and no
lymphadenopathy. Visualized portions of the upper abdomen are
unremarkable. There are no aggressive appearing lytic or blastic
lesions noted in the visualized portions of the skeleton.
IMPRESSION: 1.  Aortic Atherosclerosis (0198X-VVM.M).

## 2022-11-26 ENCOUNTER — Ambulatory Visit: Payer: 59 | Attending: Cardiology

## 2022-11-26 ENCOUNTER — Other Ambulatory Visit: Payer: Self-pay

## 2022-11-26 VITALS — BP 130/62 | HR 91

## 2022-11-26 DIAGNOSIS — I480 Paroxysmal atrial fibrillation: Secondary | ICD-10-CM

## 2022-11-26 NOTE — Treatment Plan (Signed)
   Nurse Visit   Date of Encounter: 11/26/2022 ID: Jermaine Rose, DOB April 10, 1961, MRN [id number]  PCP:  Elenore Paddy, NP   [facility] Providers Cardiologist:  Jyl Heinz Click to update primary MD,subspecialty MD or APP then REFRESH:1}     Visit Details   VS:  BP 130/62 (BP Location: Right Arm, Patient Position: Sitting, Cuff Size: Normal)   Pulse 91  , BMI There is no height or weight on file to calculate BMI.  Wt Readings from Last 3 Encounters:  02/19/22 196 lb 10.1 oz (89.2 kg)  07/03/21 202 lb 6.4 oz (91.8 kg)  05/29/21 202 lb 6.4 oz (91.8 kg)     Reason for visit: Perform EKG Performed today: EKG, Vitals, Provider consulted, Education Changes (medications, testing, etc.) : Patient EKG showed he was in A-fib. Spoke to Dr. Bettina Gavia and he said to have the patient make an appointment to see Dr. Geraldo Pitter Length of Visit: 20 minutes    Medications Adjustments/Labs and Tests Ordered: No orders of the defined types were placed in this encounter.  No orders of the defined types were placed in this encounter.    Signed, Louie Casa, RN  11/26/2022 4:21 PM

## 2022-11-27 NOTE — Addendum Note (Signed)
Addended by: Anselm Pancoast on: 11/27/2022 02:51 PM   Modules accepted: Orders

## 2022-12-10 ENCOUNTER — Telehealth: Payer: Self-pay | Admitting: Cardiology

## 2022-12-10 NOTE — Telephone Encounter (Signed)
Patient c/o Palpitations:  High priority if patient c/o lightheadedness, shortness of breath, or chest pain  How long have you had palpitations/irregular HR/ Afib? Are you having the symptoms now? 2 weeks, yes  Are you currently experiencing lightheadedness, SOB or CP? no  Do you have a history of afib (atrial fibrillation) or irregular heart rhythm? yes  Have you checked your BP or HR? (document readings if available): BP has been normal, HR 95  Are you experiencing any other symptoms? no  Patient states he felt better today but is still in afib. He says he is not having any other symptoms, but his wife is very concerned so he decided to call. He says last time he was in afib he went out of it after 2 weeks.

## 2022-12-10 NOTE — Telephone Encounter (Signed)
Spoke with pt regarding Afib. He stated that he was feeling good and having no symptoms. Offered to come in for nurse visit for vitals and EKG. He stated that he would call in a week to update on the afib. If he gets symptomatic he will call sooner and come for nurse visit. Pt verbalized understanding and had no questions.

## 2022-12-17 ENCOUNTER — Ambulatory Visit: Payer: 59 | Attending: Cardiology | Admitting: Cardiology

## 2022-12-17 VITALS — BP 118/72 | HR 79

## 2022-12-17 DIAGNOSIS — I480 Paroxysmal atrial fibrillation: Secondary | ICD-10-CM

## 2022-12-17 DIAGNOSIS — I2510 Atherosclerotic heart disease of native coronary artery without angina pectoris: Secondary | ICD-10-CM | POA: Diagnosis not present

## 2022-12-17 DIAGNOSIS — R0609 Other forms of dyspnea: Secondary | ICD-10-CM

## 2022-12-17 LAB — LAB REPORT - SCANNED: EGFR: 60

## 2022-12-17 NOTE — Patient Instructions (Addendum)
Medication Instructions:  Your physician recommends that you continue on your current medications as directed. Please refer to the Current Medication list given to you today.  *If you need a refill on your cardiac medications before your next appointment, please call your pharmacy*   Lab Work: Your physician recommends that you return for lab work in:   Labs today: CBC, BMP  If you have labs (blood work) drawn today and your tests are completely normal, you will receive your results only by: [place] (if you have [place]) OR A paper copy in the mail If you have any lab test that is abnormal or we need to change your treatment, we will call you to review the results.   Testing/Procedures: Your physician has recommended that you have a Cardioversion (DCCV). Electrical Cardioversion uses a jolt of electricity to your heart either through paddles or wired patches attached to your chest. This is a controlled, usually prescheduled, procedure. Defibrillation is done under light anesthesia in the hospital, and you usually go home the day of the procedure. This is done to get your heart back into a normal rhythm. You are not awake for the procedure. Please see the instruction sheet given to you today.   Your physician has requested that you have an echocardiogram. Echocardiography is a painless test that uses sound waves to create images of your heart. It provides your doctor with information about the size and shape of your heart and how well your heart's chambers and valves are working. This procedure takes approximately one hour. There are no restrictions for this procedure. Please do NOT wear cologne, perfume, aftershave, or lotions (deodorant is allowed). Please arrive 15 minutes prior to your appointment time.     Follow-Up: At [facility], you and your health needs are our priority.  As part of our continuing mission to provide you with exceptional heart care, we have  created designated Provider Care Teams.  These Care Teams include your primary Cardiologist (physician) and Advanced Practice Providers (APPs -  Physician Assistants and Nurse Practitioners) who all work together to provide you with the care you need, when you need it.  We recommend signing up for the patient portal called "MyChart".  Sign up information is provided on this After Visit Summary.  MyChart is used to connect with patients for Virtual Visits (Telemedicine).  Patients are able to view lab/test results, encounter notes, upcoming appointments, etc.  Non-urgent messages can be sent to your provider as well.   To learn more about what you can do with MyChart, go to [email or web address].    Your next appointment:   Keep next appointment with Dr. Geraldo Pitter  Provider:   Jyl Heinz, MD    Other Instructions None

## 2022-12-17 NOTE — Progress Notes (Unsigned)
Cardiology Office Note:    Date:  12/17/2022   ID:  Jermaine Rose, DOB [birth year], MRN [id number]  PCP:  Elenore Paddy, NP  Cardiologist:  Jenne Campus, MD    Referring MD: Elenore Paddy, NP   No chief complaint on file. I have atrial fibrillation  History of Present Illness:    Jermaine Rose is a 62 y.o. male past medical history significant for paroxysmal atrial fibrillation, he had episode about a year ago he was getting ready to have cardioversion after being anticoagulated and then when he came for cardioversion he was noted to be in sinus rhythm was doing quite well until in February he said he started having palpitations again he came to office EKG was done he was found to be in atrial fibrillation.  He has been continuously on anticoagulation was brought back today to check and if he converts spontaneously, sadly, he is still in atrial fibrillation.  Rate is controlled he is feeling weak and tired and exhausted but overall seems to be doing quite well and tolerating atrial fibrillation quite well  Past Medical History:  Diagnosis Date   Arthritis    Atrial fibrillation ([place])    Bunionette of right foot 05/23/2018   Carpal tunnel syndrome    Cigarette smoker 08/19/2018   Coronary artery disease 05/10/2021   Dyspnea on exertion 08/19/2018   External hemorrhoids    Foot pain    Hypertension 08/13/2018   Hyponatremia    MRSA infection    6-8 years ago in his neck per patient   Numbness and tingling in both hands    Osteochondroma    PAF (paroxysmal atrial fibrillation) ([place]) 03/09/2021   Peroneal tendonitis, right 03/12/2017   Primary osteoarthritis of both first carpometacarpal joints 02/15/2020   Syncope 08/13/2018   had Cath at [facility] but no follow up needed per patient   Unspecified atrial fibrillation ([place]) 12/30/2020   URI (upper respiratory infection) 08/13/2018    Past Surgical History:  Procedure Laterality Date   ANKLE SURGERY Right 2008,2009   CARPAL TUNNEL  RELEASE Right 05/02/2020   Procedure: CARPAL TUNNEL RELEASE;  Surgeon: Leanora Cover, MD;  Location: [facility];  Service: Orthopedics;  Laterality: Right;   CARPAL TUNNEL RELEASE Left 09/27/2020   Procedure: CARPAL TUNNEL RELEASE;  Surgeon: Leanora Cover, MD;  Location: [facility];  Service: Orthopedics;  Laterality: Left;  Bier block   KNEE SURGERY Bilateral    Left in 2007 and Right in 1981    Current Medications: No outpatient medications have been marked as taking for the 12/17/22 encounter (Office Visit) with Park Liter, MD.     Allergies:   Lisinopril   Social History   Socioeconomic History   Marital status: Married    Spouse name: Not on file   Number of children: 1   Years of education: Not on file   Highest education level: High school graduate  Occupational History    Comment: [employer]  Tobacco Use   Smoking status: Every Day    Packs/day: 1.00    Years: 15.00    Additional pack years: 0.00    Total pack years: 15.00    Types: Cigarettes   Smokeless tobacco: Never  Vaping Use   Vaping Use: Never used  Substance and Sexual Activity   Alcohol use: Yes    Alcohol/week: 7.0 - 14.0 standard drinks of alcohol    Types: 7 - 14 Cans of beer per week  Drug use: Not Currently   Sexual activity: Not on file  Other Topics Concern   Not on file  Social History Narrative   Lives with wife   caffeine use heavy   Social Determinants of Health   Financial Resource Strain: Not on file  Food Insecurity: Not on file  Transportation Needs: Not on file  Physical Activity: Not on file  Stress: Not on file  Social Connections: Not on file     Family History: The patient's family history includes Atrial fibrillation in his father and mother; Breast cancer in his mother; CAD in his father; Heart attack in his father; Hypertension in his father. ROS:   Please see the history of present illness.    All 14 point review of systems negative  except as described per history of present illness  EKGs/Labs/Other Studies Reviewed:      Recent Labs: No results found for requested labs within last 365 days.  Recent Lipid Panel    Component Value Date/Time   CHOL 143 08/14/2021 0828   TRIG 32 08/14/2021 0828   HDL 90 08/14/2021 0828   CHOLHDL 1.6 08/14/2021 0828   LDLCALC 44 08/14/2021 0828    Physical Exam:    VS:  BP 118/72 (BP Location: Right Arm, Patient Position: Sitting, Cuff Size: Normal)   Pulse 79     Wt Readings from Last 3 Encounters:  02/19/22 196 lb 10.1 oz (89.2 kg)  07/03/21 202 lb 6.4 oz (91.8 kg)  05/29/21 202 lb 6.4 oz (91.8 kg)     GEN:  Well nourished, well developed in no acute distress HEENT: Normal NECK: No JVD; No carotid bruits LYMPHATICS: No lymphadenopathy CARDIAC: Irregularly irregular, no murmurs, no rubs, no gallops RESPIRATORY:  Clear to auscultation without rales, wheezing or rhonchi  ABDOMEN: Soft, non-tender, non-distended MUSCULOSKELETAL:  No edema; No deformity  SKIN: Warm and dry LOWER EXTREMITIES: no swelling NEUROLOGIC:  Alert and oriented x 3 PSYCHIATRIC:  Normal affect   ASSESSMENT:    1. PAF (paroxysmal atrial fibrillation) ([place])   2. Coronary artery disease involving native coronary artery of native heart without angina pectoris   3. Dyspnea on exertion    PLAN:    In order of problems listed above:  Paroxysmal atrial fibrillation, CHADS2 vascular equals 2.  He is anticoagulant which I continue he did not interrupt his anticoagulation for months. He is symptomatic with atrial fibrillation we will schedule him to have cardioversion procedure explained to him including all risk benefits as well as alternatives.  In terms of [place] management of this problem we will schedule him to have echocardiogram to look at left ventricle ejection fraction so hopefully by doing this we will will have better understanding what the choice of antiarrhythmic we have. Coronary  disease I did review his coronary CT angio done last year which show fractional flow reserve indicating small second Degele branch that have hemodynamically significant stenosis.  He is asymptomatic from that point review and appropriate guideline directed medical therapy which I will continue. We did discuss potentially doing sleep study.  He is not interested.   Medication Adjustments/Labs and Tests Ordered: Current medicines are reviewed at length with the patient today.  Concerns regarding medicines are outlined above.  No orders of the defined types were placed in this encounter.  Medication changes: No orders of the defined types were placed in this encounter.   Signed, Park Liter, MD, [facility] 12/17/2022 10:00 AM    [facility] 

## 2022-12-18 NOTE — Addendum Note (Signed)
Addended by: Edwyna Shell I on: 12/18/2022 08:33 AM   Modules accepted: Orders

## 2022-12-19 ENCOUNTER — Telehealth: Payer: Self-pay

## 2022-12-19 ENCOUNTER — Ambulatory Visit: Payer: 59 | Attending: Cardiology

## 2022-12-19 DIAGNOSIS — I2510 Atherosclerotic heart disease of native coronary artery without angina pectoris: Secondary | ICD-10-CM | POA: Diagnosis not present

## 2022-12-19 DIAGNOSIS — I480 Paroxysmal atrial fibrillation: Secondary | ICD-10-CM

## 2022-12-19 DIAGNOSIS — R0609 Other forms of dyspnea: Secondary | ICD-10-CM | POA: Diagnosis not present

## 2022-12-19 LAB — ECHOCARDIOGRAM COMPLETE
Area-P 1/2: 5.75 cm2
S' Lateral: 2.6 cm

## 2022-12-19 NOTE — Telephone Encounter (Signed)
[facility] to have them fax the patient's latest lab work over so he can be set-up for a cardioversion. They did not answer the phone, left message for them to call back.

## 2022-12-21 DIAGNOSIS — I4891 Unspecified atrial fibrillation: Secondary | ICD-10-CM

## 2022-12-25 ENCOUNTER — Telehealth: Payer: Self-pay

## 2022-12-25 NOTE — Telephone Encounter (Signed)
ECHO results left on My Chart per Dr. Wendy Poet note. Pt viewed results. Routed to PCP.

## 2022-12-26 ENCOUNTER — Telehealth: Payer: Self-pay | Admitting: Cardiology

## 2022-12-26 NOTE — Telephone Encounter (Signed)
Spoke with pt and he will come by 12/27/22 if he finishes with work. Pt will call tomorrow.

## 2022-12-26 NOTE — Telephone Encounter (Signed)
Called patient and he reported that he feels like he is back in A-fib. His heart rate is ranging from 114-144 and his heart rate at the time of the call was 104. He feels very tired when standing. He has no SOB or chest pain, pressure or discomfort. Patient states that he had a cardioversion last Friday but feels that his A-fib has returned. Please advise.

## 2022-12-26 NOTE — Telephone Encounter (Signed)
Patient c/o Palpitations:  High priority if patient c/o lightheadedness, shortness of breath, or chest pain  How long have you had palpitations/irregular HR/ Afib? Are you having the symptoms now? 12/24/22 is actively having symptoms now  Are you currently experiencing lightheadedness, SOB or CP? No   Do you have a history of afib (atrial fibrillation) or irregular heart rhythm? Yes  Have you checked your BP or HR? (document readings if available): 12/26/22 HR ranging 114-144. HR at time of call 104  Are you experiencing any other symptoms? Really tired when standing

## 2022-12-28 ENCOUNTER — Other Ambulatory Visit: Payer: Self-pay

## 2023-01-07 ENCOUNTER — Ambulatory Visit: Payer: 59 | Attending: Cardiology | Admitting: Cardiology

## 2023-01-07 ENCOUNTER — Encounter: Payer: Self-pay | Admitting: Cardiology

## 2023-01-07 VITALS — BP 122/74 | HR 71 | Ht 73.0 in | Wt 192.0 lb

## 2023-01-07 DIAGNOSIS — F17210 Nicotine dependence, cigarettes, uncomplicated: Secondary | ICD-10-CM | POA: Diagnosis not present

## 2023-01-07 DIAGNOSIS — I2510 Atherosclerotic heart disease of native coronary artery without angina pectoris: Secondary | ICD-10-CM

## 2023-01-07 DIAGNOSIS — I480 Paroxysmal atrial fibrillation: Secondary | ICD-10-CM | POA: Diagnosis not present

## 2023-01-07 DIAGNOSIS — I10 Essential (primary) hypertension: Secondary | ICD-10-CM | POA: Diagnosis not present

## 2023-01-07 NOTE — Progress Notes (Signed)
Cardiology Office Note:    Date:  01/07/2023   ID:  Jermaine Rose, DOB 1960-10-15, MRN [id number]  PCP:  Julianne Handlerouth, Sarah T, NP  Cardiologist:  Garwin Brothersajan R Thomasenia Dowse, MD   Referring MD: Julianne Handlerouth, Sarah T, NP    ASSESSMENT:    1. Coronary artery disease involving native coronary artery of native heart without angina pectoris   2. PAF (paroxysmal atrial fibrillation)   3. Cigarette smoker   4. Primary hypertension    PLAN:    In order of problems listed above:  Coronary artery disease: Secondary prevention stressed with the patient.  Importance of compliance with diet medication stressed any vocalized understanding. Essential hypertension: Blood pressure stable and diet was emphasized. Mixed dyslipidemia I reviewed labs from primary care and discussed with him at length the lipids.  Questions were answered to his satisfaction. Paroxysmal atrial fibrillation:I discussed with the patient atrial fibrillation, disease process. Management and therapy including rate and rhythm control, anticoagulation benefits and potential risks were discussed extensively with the patient. Patient had multiple questions which were answered to patient's satisfaction.  I gave him options of medication such as drainage from and also discussed ablation.  He prefers to be evaluated for atrial fibrillation ablation and I will refer him to our electrophysiology colleagues for this. Patient will be seen in follow-up appointment in 6 months or earlier if the patient has any concerns.  Addendum: Cigarette smoker: I spent 5 minutes with the patient discussing solely about smoking. Smoking cessation was counseled. I suggested to the patient also different medications and pharmacological interventions. Patient is keen to try stopping on its own at this time. He will get back to me if he needs any further assistance in this matter.     Medication Adjustments/Labs and Tests Ordered: Current medicines are reviewed at length with the  patient today.  Concerns regarding medicines are outlined above.  No orders of the defined types were placed in this encounter.  No orders of the defined types were placed in this encounter.    No chief complaint on file.    History of Present Illness:    Jermaine Rose is a 62 y.o. male.  Patient has past medical history of coronary artery disease, paroxysmal atrial fibrillation, hypertension and cigarette smoking.  Unfortunately continues to smoke.  He mentions to me that he can feel himself going in and out of atrial fibrillation.  He had cardioversion but this was not successful.  He takes care of activities of daily living.  No chest pain orthopnea PND or syncope.  At the time of my evaluation, the patient is alert awake oriented and in no distress.  Past Medical History:  Diagnosis Date   Arthritis    Atrial fibrillation    Bunionette of right foot 05/23/2018   Carpal tunnel syndrome    Cigarette smoker 08/19/2018   Coronary artery disease 05/10/2021   Dyspnea on exertion 08/19/2018   External hemorrhoids    Foot pain    Hypertension 08/13/2018   Hyponatremia    MRSA infection    6-8 years ago in his neck per patient   Numbness and tingling in both hands    Osteochondroma    PAF (paroxysmal atrial fibrillation) 03/09/2021   Peroneal tendonitis, right 03/12/2017   Primary osteoarthritis of both first carpometacarpal joints 02/15/2020   Syncope 08/13/2018   had Cath at [place] but no follow up needed per patient   Unspecified atrial fibrillation 12/30/2020   URI (upper respiratory infection) 08/13/2018  Past Surgical History:  Procedure Laterality Date   ANKLE SURGERY Right 2008,2009   CARPAL TUNNEL RELEASE Right 05/02/2020   Procedure: CARPAL TUNNEL RELEASE;  Surgeon: Betha Loa, MD;  Location: [facility];  Service: Orthopedics;  Laterality: Right;   CARPAL TUNNEL RELEASE Left 09/27/2020   Procedure: CARPAL TUNNEL RELEASE;  Surgeon: Betha Loa, MD;   Location: [facility];  Service: Orthopedics;  Laterality: Left;  Bier block   KNEE SURGERY Bilateral    Left in 2007 and Right in 1981    Current Medications: Current Meds  Medication Sig   amLODipine (NORVASC) 10 MG tablet Take 1 tablet (10 mg total) by mouth in the morning.   apixaban (ELIQUIS) 5 MG TABS tablet TAKE ONE TABLET BY MOUTH TWICE DAILY   aspirin EC 81 MG tablet Take 1 tablet (81 mg total) by mouth daily. Swallow whole.   diltiazem (CARDIZEM CD) 180 MG 24 hr capsule Take 1 capsule (180 mg total) by mouth every morning.   indapamide (LOZOL) 2.5 MG tablet Take 2.5 mg by mouth every morning.   metoprolol succinate (TOPROL-XL) 100 MG 24 hr tablet Take 100 mg by mouth daily.   Multiple Vitamin (MULTIVITAMIN WITH MINERALS) TABS tablet Take 1 tablet by mouth in the morning.   nitroGLYCERIN (NITROSTAT) 0.4 MG SL tablet Place 0.4 mg under the tongue every 5 (five) minutes as needed for chest pain.   Polyethyl Glycol-Propyl Glycol (LUBRICANT EYE DROPS) 0.4-0.3 % SOLN Place 1-2 drops into both eyes 3 (three) times daily as needed for dry eyes (dry/irritated eyes).   psyllium (METAMUCIL) 58.6 % packet Take 1 packet by mouth in the morning.   rosuvastatin (CRESTOR) 10 MG tablet Take 10 mg by mouth daily.   tadalafil (CIALIS) 10 MG tablet Take 10 mg by mouth daily as needed for erectile dysfunction.     Allergies:   Lisinopril   Social History   Socioeconomic History   Marital status: Married    Spouse name: Not on file   Number of children: 1   Years of education: Not on file   Highest education level: High school graduate  Occupational History    Comment: [employer]  Tobacco Use   Smoking status: Every Day    Packs/day: 1.00    Years: 15.00    Additional pack years: 0.00    Total pack years: 15.00    Types: Cigarettes   Smokeless tobacco: Never  Vaping Use   Vaping Use: Never used  Substance and Sexual Activity   Alcohol use: Yes    Alcohol/week: 7.0 - 14.0  standard drinks of alcohol    Types: 7 - 14 Cans of beer per week   Drug use: Not Currently   Sexual activity: Not on file  Other Topics Concern   Not on file  Social History Narrative   Lives with wife   caffeine use heavy   Social Determinants of Health   Financial Resource Strain: Not on file  Food Insecurity: Not on file  Transportation Needs: Not on file  Physical Activity: Not on file  Stress: Not on file  Social Connections: Not on file     Family History: The patient's family history includes Atrial fibrillation in his father and mother; Breast cancer in his mother; CAD in his father; Heart attack in his father; Hypertension in his father.  ROS:   Please see the history of present illness.    All other systems reviewed and are negative.  EKGs/Labs/Other  Studies Reviewed:    The following studies were reviewed today: EKG reveals sinus rhythm with nonspecific ST-T changes   Recent Labs: No results found for requested labs within last 365 days.  Recent Lipid Panel    Component Value Date/Time   CHOL 143 08/14/2021 0828   TRIG 32 08/14/2021 0828   HDL 90 08/14/2021 0828   CHOLHDL 1.6 08/14/2021 0828   LDLCALC 44 08/14/2021 0828    Physical Exam:    VS:  BP 122/74   Pulse 71   Ht 6\' 1"  (1.854 m)   Wt 192 lb (87.1 kg)   SpO2 97%   BMI 25.33 kg/m     Wt Readings from Last 3 Encounters:  01/07/23 192 lb (87.1 kg)  02/19/22 196 lb 10.1 oz (89.2 kg)  07/03/21 202 lb 6.4 oz (91.8 kg)     GEN: Patient is in no acute distress HEENT: Normal NECK: No JVD; No carotid bruits LYMPHATICS: No lymphadenopathy CARDIAC: Hear sounds regular, 2/6 systolic murmur at the apex. RESPIRATORY:  Clear to auscultation without rales, wheezing or rhonchi  ABDOMEN: Soft, non-tender, non-distended MUSCULOSKELETAL:  No edema; No deformity  SKIN: Warm and dry NEUROLOGIC:  Alert and oriented x 3 PSYCHIATRIC:  Normal affect   Signed, Garwin Brothers, MD  01/07/2023 12:56 PM     [facility]

## 2023-01-07 NOTE — Patient Instructions (Signed)
Medication Instructions:  Your physician recommends that you continue on your current medications as directed. Please refer to the Current Medication list given to you today.  *If you need a refill on your cardiac medications before your next appointment, please call your pharmacy*   Lab Work: None If you have labs (blood work) drawn today and your tests are completely normal, you will receive your results only by: MyChart Message (if you have MyChart) OR A paper copy in the mail If you have any lab test that is abnormal or we need to change your treatment, we will call you to review the results.   Testing/Procedures: None   Follow-Up: At [facility], you and your health needs are our priority.  As part of our continuing mission to provide you with exceptional heart care, we have created designated Provider Care Teams.  These Care Teams include your primary Cardiologist (physician) and Advanced Practice Providers (APPs -  Physician Assistants and Nurse Practitioners) who all work together to provide you with the care you need, when you need it.  We recommend signing up for the patient portal called "MyChart".  Sign up information is provided on this After Visit Summary.  MyChart is used to connect with patients for Virtual Visits (Telemedicine).  Patients are able to view lab/test results, encounter notes, upcoming appointments, etc.  Non-urgent messages can be sent to your provider as well.   To learn more about what you can do with MyChart, go to [email or web address].    Your next appointment:   9 month(s)  Provider:   Rajan Revankar, MD    Other Instructions None  

## 2023-01-28 ENCOUNTER — Encounter: Payer: Self-pay | Admitting: Cardiology

## 2023-01-28 ENCOUNTER — Ambulatory Visit: Payer: 59 | Attending: Cardiology | Admitting: Cardiology

## 2023-01-28 VITALS — BP 120/74 | HR 84 | Ht 73.0 in | Wt 193.8 lb

## 2023-01-28 DIAGNOSIS — I10 Essential (primary) hypertension: Secondary | ICD-10-CM

## 2023-01-28 DIAGNOSIS — D6869 Other thrombophilia: Secondary | ICD-10-CM | POA: Diagnosis not present

## 2023-01-28 DIAGNOSIS — I2510 Atherosclerotic heart disease of native coronary artery without angina pectoris: Secondary | ICD-10-CM

## 2023-01-28 DIAGNOSIS — I480 Paroxysmal atrial fibrillation: Secondary | ICD-10-CM | POA: Diagnosis not present

## 2023-01-28 DIAGNOSIS — Z01812 Encounter for preprocedural laboratory examination: Secondary | ICD-10-CM

## 2023-01-28 NOTE — Progress Notes (Signed)
Electrophysiology Office Note   Date:  01/28/2023   ID:  Jermaine Rose, DOB Jan 23, 1961, MRN [id number]  PCP:  Julianne Handler, NP  Cardiologist:  Bing Matter Primary Electrophysiologist:  Regan Lemming, MD    Chief Complaint: AF   History of Present Illness: Jermaine Rose is a 62 y.o. male who is being seen today for the evaluation of AF at the request of Revankar, Aundra Dubin, MD. Presenting today for electrophysiology evaluation.  He has a history seen for atrial fibrillation, coronary artery disease, hypertension.  Poorly when he is in atrial fibrillation.  He has significant weakness, fatigue, shortness of breath.  He finds it difficult to do all of his daily activities due to his atrial fibrillation.  When he is in normal rhythm he feels well.  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  He has been having episodes that are more prolonged over the last year.  Today, he denies symptoms of palpitations, chest pain, shortness of breath, orthopnea, PND, lower extremity edema, claudication, dizziness, presyncope, syncope, bleeding, or neurologic sequela. The patient is tolerating medications without difficulties.    Past Medical History:  Diagnosis Date   Arthritis    Atrial fibrillation (HCC)    Bunionette of right foot 05/23/2018   Carpal tunnel syndrome    Cigarette smoker 08/19/2018   Coronary artery disease 05/10/2021   Dyspnea on exertion 08/19/2018   External hemorrhoids    Foot pain    Hypertension 08/13/2018   Hyponatremia    MRSA infection    6-8 years ago in his neck per patient   Numbness and tingling in both hands    Osteochondroma    PAF (paroxysmal atrial fibrillation) (HCC) 03/09/2021   Peroneal tendonitis, right 03/12/2017   Primary osteoarthritis of both first carpometacarpal joints 02/15/2020   Syncope 08/13/2018   had Cath at [place] but no follow up needed per patient   Unspecified atrial fibrillation (HCC) 12/30/2020   URI (upper  respiratory infection) 08/13/2018   Past Surgical History:  Procedure Laterality Date   ANKLE SURGERY Right 2008,2009   CARPAL TUNNEL RELEASE Right 05/02/2020   Procedure: CARPAL TUNNEL RELEASE;  Surgeon: Betha Loa, MD;  Location: [facility];  Service: Orthopedics;  Laterality: Right;   CARPAL TUNNEL RELEASE Left 09/27/2020   Procedure: CARPAL TUNNEL RELEASE;  Surgeon: Betha Loa, MD;  Location: [facility];  Service: Orthopedics;  Laterality: Left;  Bier block   KNEE SURGERY Bilateral    Left in 2007 and Right in 1981     Current Outpatient Medications  Medication Sig Dispense Refill   amLODipine (NORVASC) 10 MG tablet Take 1 tablet (10 mg total) by mouth in the morning. 90 tablet 3   apixaban (ELIQUIS) 5 MG TABS tablet TAKE ONE TABLET BY MOUTH TWICE DAILY 180 tablet 1   aspirin EC 81 MG tablet Take 1 tablet (81 mg total) by mouth daily. Swallow whole. 90 tablet 3   diltiazem (CARDIZEM CD) 180 MG 24 hr capsule Take 1 capsule (180 mg total) by mouth every morning. 90 capsule 1   indapamide (LOZOL) 2.5 MG tablet Take 2.5 mg by mouth every morning.     metoprolol succinate (TOPROL-XL) 100 MG 24 hr tablet Take 100 mg by mouth daily.     Multiple Vitamin (MULTIVITAMIN WITH MINERALS) TABS tablet Take 1 tablet by mouth in the morning.     Polyethyl Glycol-Propyl Glycol (LUBRICANT EYE DROPS) 0.4-0.3 %  SOLN Place 1-2 drops into both eyes 3 (three) times daily as needed for dry eyes (dry/irritated eyes).     psyllium (METAMUCIL) 58.6 % packet Take 1 packet by mouth in the morning.     rosuvastatin (CRESTOR) 10 MG tablet Take 10 mg by mouth daily.     tadalafil (CIALIS) 10 MG tablet Take 10 mg by mouth daily as needed for erectile dysfunction.     No current facility-administered medications for this visit.    Allergies:   Lisinopril   Social History:  The patient  reports that he has been smoking cigarettes. He has a 15.00 pack-year smoking history. He has  never used smokeless tobacco. He reports current alcohol use of about 7.0 - 14.0 standard drinks of alcohol per week. He reports that he does not currently use drugs.   Family History:  The patient's family history includes Atrial fibrillation in his father and mother; Breast cancer in his mother; CAD in his father; Heart attack in his father; Hypertension in his father.    ROS:  Please see the history of present illness.   Otherwise, review of systems is positive for none.   All other systems are reviewed and negative.    PHYSICAL EXAM: VS:  BP 120/74   Pulse 84   Ht 6\' 1"  (1.854 m)   Wt 193 lb 12.8 oz (87.9 kg)   SpO2 97%   BMI 25.57 kg/m  , BMI Body mass index is 25.57 kg/m. GEN: Well nourished, well developed, in no acute distress  HEENT: normal  Neck: no JVD, carotid bruits, or masses Cardiac: RRR; no murmurs, rubs, or gallops,no edema  Respiratory:  clear to auscultation bilaterally, normal work of breathing GI: soft, nontender, nondistended, + BS MS: no deformity or atrophy  Skin: warm and dry Neuro:  Strength and sensation are intact Psych: euthymic mood, full affect  EKG:  EKG is not ordered today. Personal review of the ekg ordered 01/07/23 shows sinus rhythm  Recent Labs: No results found for requested labs within last 365 days.    Lipid Panel     Component Value Date/Time   CHOL 143 08/14/2021 0828   TRIG 32 08/14/2021 0828   HDL 90 08/14/2021 0828   CHOLHDL 1.6 08/14/2021 0828   LDLCALC 44 08/14/2021 0828     Wt Readings from Last 3 Encounters:  01/28/23 193 lb 12.8 oz (87.9 kg)  01/07/23 192 lb (87.1 kg)  02/19/22 196 lb 10.1 oz (89.2 kg)      Other studies Reviewed: Additional studies/ records that were reviewed today include: TTE 12/19/22  Review of the above records today demonstrates:   1. Left ventricular ejection fraction, by estimation, is 60 to 65%. The  left ventricle has normal function. The left ventricle has no regional  wall motion  abnormalities. There is moderate left ventricular hypertrophy.  Left ventricular diastolic  parameters are indeterminate.   2. Right ventricular systolic function is normal. The right ventricular  size is normal. There is mildly elevated pulmonary artery systolic  pressure.   3. The mitral valve is normal in structure. Mild mitral valve  regurgitation. No evidence of mitral stenosis.   4. The aortic valve is normal in structure. Aortic valve regurgitation is  not visualized. No aortic stenosis is present.   5. The inferior vena cava is normal in size with greater than 50%  respiratory variability, suggesting right atrial pressure of 3 mmHg.    ASSESSMENT AND PLAN:  1.  Paroxysmal  atrial fibrillation: Currently on Eliquis.  CHA2DS2-VASc of 2.  He feels quite poorly in atrial fibrillation with significant weakness, fatigue, shortness of breath.  He has had multiple episodes of atrial fibrillation and has required cardioversion in the past.  He would benefit from a rhythm control strategy.  He does not want to start new medications.  Due to that, we Jermaine Rose plan for ablation.  Risk, benefits, and alternatives to EP study and radiofrequency ablation for afib were also discussed in detail today. These risks include but are not limited to stroke, bleeding, vascular damage, tamponade, perforation, damage to the esophagus, lungs, and other structures, pulmonary vein stenosis, worsening renal function, and death. The patient understands these risk and wishes to proceed.  We Jermaine Rose therefore proceed with catheter ablation at the next available time.  Carto, ICE, anesthesia are requested for the procedure.  Jermaine Rose also obtain CT PV protocol prior to the procedure to exclude LAA thrombus and further evaluate atrial anatomy.   2.  Second hypercoagulable state: Currently on Eliquis for atrial fibrillation  3.  Hypertension: well controlled  4.  Coronary artery disease: CT FFR negative for obstructive disease on  CTA in 2022.  Plan per primary cardiology.  Current medicines are reviewed at length with the patient today.   The patient does not have concerns regarding his medicines.  The following changes were made today:  none  Labs/ tests ordered today include:  Orders Placed This Encounter  Procedures   CT CARDIAC MORPH/PULM VEIN W/CM&W/O CA SCORE   Basic metabolic panel   CBC     Disposition:   FU with Jermaine Rose 3 months  Signed, Jermaine Wlodarczyk Jorja Loa, MD  01/28/2023 10:58 AM     [facility] [address] [place] [address] [phone] (office) [phone] (fax)

## 2023-01-28 NOTE — Patient Instructions (Signed)
Medication Instructions:  Your physician recommends that you continue on your current medications as directed. Please refer to the Current Medication list given to you today.  *If you need a refill on your cardiac medications before your next appointment, please call your pharmacy*   Lab Work: Pre procedure labs -- see procedure instruction letter:  BMP & CBC  If you have labs (blood work) drawn today and your tests are completely normal, you will receive your results only by: MyChart Message (if you have MyChart) OR A paper copy in the mail If you have any lab test that is abnormal or we need to change your treatment, we will call you to review the results.   Testing/Procedures: Your physician has requested that you have cardiac CT within 7 days PRIOR to your ablation. Cardiac computed tomography (CT) is a painless test that uses an x-ray machine to take clear, detailed pictures of your heart.  Please follow instruction below located under "other instructions". You will get a call from our office to schedule the date for this test.  Your physician has recommended that you have an ablation. Catheter ablation is a medical procedure used to treat some cardiac arrhythmias (irregular heartbeats). During catheter ablation, a long, thin, flexible tube is put into a blood vessel in your groin (upper thigh), or neck. This tube is called an ablation catheter. It is then guided to your heart through the blood vessel. Radio frequency waves destroy small areas of heart tissue where abnormal heartbeats may cause an arrhythmia to start. Please follow instruction letter given to you today.       You are scheduled for 05/22/2023.  We will contact you at a later date to go over instructions and send CT & ablation instructions via mychart.  Follow-Up: At [facility], you and your health needs are our priority.  As part of our continuing mission to provide you with exceptional heart care, we have created  designated Provider Care Teams.  These Care Teams include your primary Cardiologist (physician) and Advanced Practice Providers (APPs -  Physician Assistants and Nurse Practitioners) who all work together to provide you with the care you need, when you need it.   Your next appointment:   1 month(s) after your ablation  The format for your next appointment:   In Person  Provider:   AFib clinic   Thank you for choosing [facility]!!   Dory Horn, RN [phone]    Other Instructions   Cardiac Ablation Cardiac ablation is a procedure to destroy (ablate) some heart tissue that is sending bad signals. These bad signals cause problems in heart rhythm. The heart has many areas that make these signals. If there are problems in these areas, they can make the heart beat in a way that is not normal. Destroying some tissues can help make the heart rhythm normal. Tell your doctor about: Any allergies you have. All medicines you are taking. These include vitamins, herbs, eye drops, creams, and over-the-counter medicines. Any problems you or family members have had with medicines that make you fall asleep (anesthetics). Any blood disorders you have. Any surgeries you have had. Any medical conditions you have, such as kidney failure. Whether you are pregnant or may be pregnant. What are the risks? This is a safe procedure. But problems may occur, including: Infection. Bruising and bleeding. Bleeding into the chest. Stroke or blood clots. Damage to nearby areas of your body. Allergies to medicines or dyes. The need for a  pacemaker if the normal system is damaged. Failure of the procedure to treat the problem. What happens before the procedure? Medicines Ask your doctor about: Changing or stopping your normal medicines. This is important. Taking aspirin and ibuprofen. Do not take these medicines unless your doctor tells you to take them. Taking other medicines, vitamins, herbs,  and supplements. General instructions Follow instructions from your doctor about what you cannot eat or drink. Plan to have someone take you home from the hospital or clinic. If you will be going home right after the procedure, plan to have someone with you for 24 hours. Ask your doctor what steps will be taken to prevent infection. What happens during the procedure?  An IV tube will be put into one of your veins. You will be given a medicine to help you relax. The skin on your neck or groin will be numbed. A cut (incision) will be made in your neck or groin. A needle will be put through your cut and into a large vein. A tube (catheter) will be put into the needle. The tube will be moved to your heart. Dye may be put through the tube. This helps your doctor see your heart. Small devices (electrodes) on the tube will send out signals. A type of energy will be used to destroy some heart tissue. The tube will be taken out. Pressure will be held on your cut. This helps stop bleeding. A bandage will be put over your cut. The exact procedure may vary among doctors and hospitals. What happens after the procedure? You will be watched until you leave the hospital or clinic. This includes checking your heart rate, breathing rate, oxygen, and blood pressure. Your cut will be watched for bleeding. You will need to lie still for a few hours. Do not drive for 24 hours or as long as your doctor tells you. Summary Cardiac ablation is a procedure to destroy some heart tissue. This is done to treat heart rhythm problems. Tell your doctor about any medical conditions you may have. Tell him or her about all medicines you are taking to treat them. This is a safe procedure. But problems may occur. These include infection, bruising, bleeding, and damage to nearby areas of your body. Follow what your doctor tells you about food and drink. You may also be told to change or stop some of your medicines. After the  procedure, do not drive for 24 hours or as long as your doctor tells you. This information is not intended to replace advice given to you by your health care provider. Make sure you discuss any questions you have with your health care provider. Document Revised: 12/08/2021 Document Reviewed: 08/20/2019 Elsevier Patient Education  2023 [employer].

## 2023-02-18 ENCOUNTER — Other Ambulatory Visit (HOSPITAL_COMMUNITY): Payer: 59

## 2023-03-06 ENCOUNTER — Telehealth: Payer: Self-pay | Admitting: Cardiology

## 2023-03-06 NOTE — Telephone Encounter (Signed)
Patient c/o Palpitations:  High priority if patient c/o lightheadedness, shortness of breath, or chest pain  How long have you had palpitations/irregular HR/ Afib? Are you having the symptoms now?  Yes, since Sunday (6/2)  Are you currently experiencing lightheadedness, SOB or CP?   Lightheaded from time to time, can't stand too long  Do you have a history of afib (atrial fibrillation) or irregular heart rhythm?   Yes  Have you checked your BP or HR? (document readings if available):       HR - 76-102 bpm  Are you experiencing any other symptoms?  No  Patient stated his BP has been doing good but wants to know what medication he can take.

## 2023-03-06 NOTE — Telephone Encounter (Signed)
Spoke to the patient, he is currently in Afib since 6/2 his  apple watch is sending notifications of afib. Pt stated when he palpate his  pulse he can feel short pauses and rapid heart rate. Pt mentioned when moves from sitting to standing he feels lightheaded. Will forward to Afib clinic for advise.

## 2023-03-08 ENCOUNTER — Ambulatory Visit (HOSPITAL_COMMUNITY)
Admission: RE | Admit: 2023-03-08 | Discharge: 2023-03-08 | Disposition: A | Discharge status: Home / Self Care | Payer: 59 | Source: Ambulatory Visit | Attending: Internal Medicine | Admitting: Internal Medicine

## 2023-03-08 ENCOUNTER — Other Ambulatory Visit (HOSPITAL_COMMUNITY): Payer: Self-pay | Admitting: *Deleted

## 2023-03-08 ENCOUNTER — Encounter (HOSPITAL_COMMUNITY): Payer: Self-pay | Admitting: Internal Medicine

## 2023-03-08 VITALS — BP 122/72 | HR 74 | Ht 73.0 in | Wt 195.0 lb

## 2023-03-08 DIAGNOSIS — I480 Paroxysmal atrial fibrillation: Secondary | ICD-10-CM | POA: Diagnosis not present

## 2023-03-08 DIAGNOSIS — D6869 Other thrombophilia: Secondary | ICD-10-CM

## 2023-03-08 DIAGNOSIS — Z7901 Long term (current) use of anticoagulants: Secondary | ICD-10-CM | POA: Insufficient documentation

## 2023-03-08 DIAGNOSIS — Z79899 Other long term (current) drug therapy: Secondary | ICD-10-CM | POA: Insufficient documentation

## 2023-03-08 DIAGNOSIS — I4819 Other persistent atrial fibrillation: Secondary | ICD-10-CM | POA: Diagnosis not present

## 2023-03-08 HISTORY — DX: Other thrombophilia: D68.69

## 2023-03-08 LAB — CBC
HCT: 40.4 % (ref 39.0–52.0)
Hemoglobin: 14.6 g/dL (ref 13.0–17.0)
MCH: 33.4 pg (ref 26.0–34.0)
MCHC: 36.1 g/dL — ABNORMAL HIGH (ref 30.0–36.0)
MCV: 92.4 fL (ref 80.0–100.0)
Platelets: 329 10*3/uL (ref 150–400)
RBC: 4.37 MIL/uL (ref 4.22–5.81)
RDW: 11.6 % (ref 11.5–15.5)
WBC: 7.5 10*3/uL (ref 4.0–10.5)
nRBC: 0 % (ref 0.0–0.2)

## 2023-03-08 LAB — BASIC METABOLIC PANEL
Anion gap: 12 (ref 5–15)
BUN: 9 mg/dL (ref 8–23)
CO2: 23 mmol/L (ref 22–32)
Calcium: 8.8 mg/dL — ABNORMAL LOW (ref 8.9–10.3)
Chloride: 88 mmol/L — ABNORMAL LOW (ref 98–111)
Creatinine, Ser: 0.76 mg/dL (ref 0.61–1.24)
GFR, Estimated: >60 mL/min (ref >60)
Glucose, Bld: 117 mg/dL — ABNORMAL HIGH (ref 70–99)
Potassium: 3.1 mmol/L — ABNORMAL LOW (ref 3.5–5.1)
Sodium: 123 mmol/L — ABNORMAL LOW (ref 135–145)

## 2023-03-08 MED ORDER — AMIODARONE HCL 200 MG PO TABS: ORAL_TABLET | ORAL | 3 refills | Status: DC

## 2023-03-08 MED ORDER — POTASSIUM CHLORIDE ER 10 MEQ PO TBCR: 10.0000 meq | EXTENDED_RELEASE_TABLET | Freq: Every day | ORAL | 1 refills | Status: DC

## 2023-03-08 NOTE — Progress Notes (Signed)
Primary Care Physician: Julianne Handler, NP Primary Cardiologist: Dr. Tomie China Primary Electrophysiologist: Dr. Elberta Fortis Referring Physician: Dr. Myrtie Cruise Mesina is a 62 y.o. male with a history of tobacco use, HTN, CAD, and paroxysmal atrial fibrillation who presents for consultation in the [facility].  Patient seen by Dr. Estill Dooms on 01/28/23 and scheduled for ablation 05/22/23. He contacted office noting he has been in Afib since 6/2. Patient is on Eliquis 5 mg BID for a CHADS2VASC score of 2.  On evaluation today, he is currently in rate controlled Afib. He feels tired when in Afib. He has trouble doing daily activities due to lack of energy. Patient states he spoke with Dr. Elberta Fortis about potentially starting a medication before ablation in case his Afib came back.   He is compliant with anticoagulation and has not missed any doses. He has no bleeding concerns.  Today, he denies symptoms of palpitations, chest pain, shortness of breath, orthopnea, PND, lower extremity edema, dizziness, presyncope, syncope, snoring, daytime somnolence, bleeding, or neurologic sequela. The patient is tolerating medications without difficulties and is otherwise without complaint today.    he has a BMI of Body mass index is 25.73 kg/m.Marland Kitchen Filed Weights   03/08/23 0901  Weight: 88.5 kg    Family History  Problem Relation Age of Onset   Breast cancer Mother    Atrial fibrillation Mother    Atrial fibrillation Father    CAD Father    Hypertension Father    Heart attack Father     Atrial Fibrillation Management history:  Previous antiarrhythmic drugs: None Previous cardioversions: likely 12/21/22 [facility] hospital) Previous ablations: None Anticoagulation history: Eliquis 5 mg BID   Past Medical History:  Diagnosis Date   Arthritis    Atrial fibrillation (HCC)    Bunionette of right foot 05/23/2018   Carpal tunnel syndrome    Cigarette smoker 08/19/2018    Coronary artery disease 05/10/2021   Dyspnea on exertion 08/19/2018   External hemorrhoids    Foot pain    Hypertension 08/13/2018   Hyponatremia    MRSA infection    6-8 years ago in his neck per patient   Numbness and tingling in both hands    Osteochondroma    PAF (paroxysmal atrial fibrillation) (HCC) 03/09/2021   Peroneal tendonitis, right 03/12/2017   Primary osteoarthritis of both first carpometacarpal joints 02/15/2020   Syncope 08/13/2018   had Cath at [place] but no follow up needed per patient   Unspecified atrial fibrillation (HCC) 12/30/2020   URI (upper respiratory infection) 08/13/2018   Past Surgical History:  Procedure Laterality Date   ANKLE SURGERY Right 2008,2009   CARPAL TUNNEL RELEASE Right 05/02/2020   Procedure: CARPAL TUNNEL RELEASE;  Surgeon: Betha Loa, MD;  Location: [facility];  Service: Orthopedics;  Laterality: Right;   CARPAL TUNNEL RELEASE Left 09/27/2020   Procedure: CARPAL TUNNEL RELEASE;  Surgeon: Betha Loa, MD;  Location: [facility];  Service: Orthopedics;  Laterality: Left;  Bier block   KNEE SURGERY Bilateral    Left in 2007 and Right in 1981    Current Outpatient Medications  Medication Sig Dispense Refill   amiodarone (PACERONE) 200 MG tablet Take 1 tablet (200 mg total) by mouth 2 (two) times daily for 14 days, THEN 1 tablet (200 mg total) daily. 45 tablet 3   amLODipine (NORVASC) 10 MG tablet Take 1 tablet (10 mg total) by mouth in the morning. 90 tablet 3  apixaban (ELIQUIS) 5 MG TABS tablet TAKE ONE TABLET BY MOUTH TWICE DAILY 180 tablet 1   aspirin EC 81 MG tablet Take 1 tablet (81 mg total) by mouth daily. Swallow whole. 90 tablet 3   diltiazem (CARDIZEM CD) 180 MG 24 hr capsule Take 1 capsule (180 mg total) by mouth every morning. 90 capsule 1   indapamide (LOZOL) 2.5 MG tablet Take 2.5 mg by mouth every morning.     metoprolol succinate (TOPROL-XL) 100 MG 24 hr tablet Take 100 mg by mouth daily.      Multiple Vitamin (MULTIVITAMIN WITH MINERALS) TABS tablet Take 1 tablet by mouth in the morning.     Polyethyl Glycol-Propyl Glycol (LUBRICANT EYE DROPS) 0.4-0.3 % SOLN Place 1-2 drops into both eyes 3 (three) times daily as needed for dry eyes (dry/irritated eyes).     psyllium (METAMUCIL) 58.6 % packet Take 1 packet by mouth in the morning.     rosuvastatin (CRESTOR) 10 MG tablet Take 10 mg by mouth daily.     tadalafil (CIALIS) 10 MG tablet Take 10 mg by mouth daily as needed for erectile dysfunction.     No current facility-administered medications for this encounter.    Allergies  Allergen Reactions   Lisinopril Anaphylaxis    Ace inhibitors- Swelling; Angio-Edema     Social History   Socioeconomic History   Marital status: Married    Spouse name: Not on file   Number of children: 1   Years of education: Not on file   Highest education level: High school graduate  Occupational History    Comment: [employer]  Tobacco Use   Smoking status: Every Day    Packs/day: 1.00    Years: 15.00    Additional pack years: 0.00    Total pack years: 15.00    Types: Cigarettes   Smokeless tobacco: Never  Vaping Use   Vaping Use: Never used  Substance and Sexual Activity   Alcohol use: Yes    Alcohol/week: 7.0 - 14.0 standard drinks of alcohol    Types: 7 - 14 Cans of beer per week   Drug use: Not Currently   Sexual activity: Not on file  Other Topics Concern   Not on file  Social History Narrative   Lives with wife   caffeine use heavy   Social Determinants of Health   Financial Resource Strain: Not on file  Food Insecurity: Not on file  Transportation Needs: Not on file  Physical Activity: Not on file  Stress: Not on file  Social Connections: Not on file  Intimate Partner Violence: Not on file    ROS- All systems are reviewed and negative except as per the HPI above.  Physical Exam: Vitals:   03/08/23 0901  BP: 122/72  Pulse: 74  Weight: 88.5 kg  Height: 6\' 1"  (1.854  m)    GEN- The patient is a well appearing male, alert and oriented x 3 today.   Head- normocephalic, atraumatic Eyes-  Sclera clear, conjunctiva pink Ears- hearing intact Oropharynx- clear Neck- supple  Lungs- Clear to ausculation bilaterally, normal work of breathing Heart- Irregular rate and rhythm, no murmurs, rubs or gallops  GI- soft, NT, ND, + BS Extremities- no clubbing, cyanosis, or edema MS- no significant deformity or atrophy Skin- no rash or lesion Psych- euthymic mood, full affect Neuro- strength and sensation are intact  Wt Readings from Last 3 Encounters:  03/08/23 88.5 kg  01/28/23 87.9 kg  01/07/23 87.1 kg  EKG today demonstrates  Vent. rate 74 BPM PR interval * ms QRS duration 96 ms QT/QTcB 386/428 ms P-R-T axes * 61 20 Atrial fibrillation Abnormal ECG When compared with ECG of 02-May-2020 10:50, PREVIOUS ECG IS PRESENT  Echo 12/19/22 demonstrated:  1. Left ventricular ejection fraction, by estimation, is 60 to 65%. The  left ventricle has normal function. The left ventricle has no regional  wall motion abnormalities. There is moderate left ventricular hypertrophy.  Left ventricular diastolic  parameters are indeterminate.   2. Right ventricular systolic function is normal. The right ventricular  size is normal. There is mildly elevated pulmonary artery systolic  pressure.   3. The mitral valve is normal in structure. Mild mitral valve  regurgitation. No evidence of mitral stenosis.   4. The aortic valve is normal in structure. Aortic valve regurgitation is  not visualized. No aortic stenosis is present.   5. The inferior vena cava is normal in size with greater than 50%  respiratory variability, suggesting right atrial pressure of 3 mmHg.   Epic records are reviewed at length today.  CHA2DS2-VASc Score = 2  The patient's score is based upon: CHF History: 0 HTN History: 1 Diabetes History: 0 Stroke History: 0 Vascular Disease History:  1 Age Score: 0 Gender Score: 0       ASSESSMENT AND PLAN: Paroxysmal Atrial Fibrillation (ICD10:  I48.0) The patient's CHA2DS2-VASc score is 2, indicating a 2.2% annual risk of stroke.    He is currently in rate controlled Afib. He is scheduled for ablation on 8/21. He would like to take medication to help keep his Afib controlled leading up to ablation.   - We will begin patient on amiodarone 200 mg BID x 2 weeks, then 200 mg daily. - Schedule DCCV at [facility] per patient's request. - F/u 1-2 weeks after DCCV.   Secondary Hypercoagulable State (ICD10:  D68.69) The patient is at significant risk for stroke/thromboembolism based upon his CHA2DS2-VASc Score of 2.  Continue Apixaban (Eliquis).  No missed doses.  Follow up 1-2 weeks after DCCV.    Lake Bells, PA-C Afib Clinic [facility] [address] [place], [place] [address] [phone] 03/08/2023 9:39 AM

## 2023-03-08 NOTE — Patient Instructions (Addendum)
Start Amiodarone - take 1 tablet twice a day for 14 days (June 21st) then reduce to once a day take with food   Cardioversion scheduled for: Monday, June 24th   - Arrive at the [facility] and go to admitting at 10am   - Do not eat or drink anything after midnight the night prior to your procedure.   - Take all your morning medication (except diabetic medications) with a sip of water prior to arrival.  - You will not be able to drive home after your procedure.    - Do NOT miss any doses of your blood thinner - if you should miss a dose please notify our office immediately.   - If you feel as if you go back into normal rhythm prior to scheduled cardioversion, please notify our office immediately.   If your procedure is canceled in the cardioversion suite you will be charged a cancellation fee.

## 2023-03-20 ENCOUNTER — Telehealth (HOSPITAL_COMMUNITY): Payer: Self-pay | Admitting: *Deleted

## 2023-03-20 NOTE — Telephone Encounter (Signed)
Pt states for the last week he has been back in normal rhythm confirmed by apple watch. Pt will continue amiodarone load and cardioversion has been canceled. Ablation scheduled for August.

## 2023-03-25 ENCOUNTER — Ambulatory Visit (HOSPITAL_COMMUNITY): Admit: 2023-03-25 | Payer: 59 | Admitting: Cardiology

## 2023-03-25 ENCOUNTER — Other Ambulatory Visit (HOSPITAL_COMMUNITY): Payer: 59 | Admitting: Internal Medicine

## 2023-03-25 ENCOUNTER — Encounter (HOSPITAL_COMMUNITY): Payer: Self-pay

## 2023-03-25 SURGERY — CARDIOVERSION
Anesthesia: General

## 2023-04-01 ENCOUNTER — Ambulatory Visit (HOSPITAL_COMMUNITY): Payer: 59 | Admitting: Internal Medicine

## 2023-05-01 ENCOUNTER — Other Ambulatory Visit (HOSPITAL_COMMUNITY): Payer: Self-pay | Admitting: Internal Medicine

## 2023-05-01 ENCOUNTER — Other Ambulatory Visit (HOSPITAL_COMMUNITY): Payer: Self-pay

## 2023-05-06 ENCOUNTER — Other Ambulatory Visit: Payer: Self-pay | Admitting: *Deleted

## 2023-05-06 DIAGNOSIS — Z01812 Encounter for preprocedural laboratory examination: Secondary | ICD-10-CM

## 2023-05-06 DIAGNOSIS — I4819 Other persistent atrial fibrillation: Secondary | ICD-10-CM

## 2023-05-15 ENCOUNTER — Telehealth: Payer: Self-pay

## 2023-05-15 ENCOUNTER — Ambulatory Visit (HOSPITAL_COMMUNITY): Payer: 59

## 2023-05-15 NOTE — Telephone Encounter (Signed)
I have left a message on pt's phone and his Wife's phone letting them know that his Ins Co has denied coverage of his CT Scan and his Ablation. They have both been cancelled.  I advised them to call and schedule a f/u with Dr. Elberta Fortis to get a plan for going fwd.   I have also sent a MyChart message.

## 2023-05-16 ENCOUNTER — Encounter (HOSPITAL_COMMUNITY): Payer: Self-pay

## 2023-05-16 ENCOUNTER — Ambulatory Visit (HOSPITAL_COMMUNITY)
Admission: RE | Admit: 2023-05-16 | Discharge: 2023-05-16 | Disposition: A | Discharge status: Home / Self Care | Payer: 59 | Source: Ambulatory Visit | Attending: Cardiology | Admitting: Cardiology

## 2023-05-20 ENCOUNTER — Telehealth: Payer: Self-pay | Admitting: Cardiology

## 2023-05-20 NOTE — Telephone Encounter (Signed)
Patient c/o Palpitations:  High priority if patient c/o lightheadedness, shortness of breath, or chest pain  How long have you had palpitations/irregular HR/ Afib? Are you having the symptoms now? yes  Are you currently experiencing lightheadedness, SOB or CP? no  Do you have a history of afib (atrial fibrillation) or irregular heart rhythm? yes  Have you checked your BP or HR? (document readings if available):    Are you experiencing any other symptoms? No

## 2023-05-20 NOTE — Telephone Encounter (Signed)
Pt says his watch said he is in afib. Started this morning. Pt states he felt like he was having afib since Saturday, but watch says it started today. No symptoms. Calling b/c he thought it means the Amiodarone is not working.  Educated on this medication. Pt will call back if out of rhythm for more than 24-48 hours and/or symptoms occur. Patient verbalized understanding and agreeable to plan.

## 2023-05-22 ENCOUNTER — Encounter (HOSPITAL_COMMUNITY): Payer: Self-pay

## 2023-05-22 ENCOUNTER — Ambulatory Visit (HOSPITAL_COMMUNITY): Admit: 2023-05-22 | Payer: 59 | Admitting: Cardiology

## 2023-05-22 SURGERY — ATRIAL FIBRILLATION ABLATION
Anesthesia: General

## 2023-05-31 ENCOUNTER — Other Ambulatory Visit (HOSPITAL_COMMUNITY): Payer: Self-pay | Admitting: Internal Medicine

## 2023-06-05 ENCOUNTER — Telehealth: Payer: Self-pay | Admitting: Cardiology

## 2023-06-05 DIAGNOSIS — I4819 Other persistent atrial fibrillation: Secondary | ICD-10-CM

## 2023-06-05 NOTE — Telephone Encounter (Signed)
Monia Pouch is calling to talk with Dr. Elberta Fortis or nurse in regards to cardiac ablation that was denied. Says that it is urgent. Want to know what has changed.

## 2023-06-05 NOTE — Telephone Encounter (Signed)
Forwarding to [employer], in billing, who has been working on this.

## 2023-06-07 NOTE — Telephone Encounter (Signed)
LM for pt to call back to reschedule his Ablation now that we have approval from his Insurance.

## 2023-06-07 NOTE — Telephone Encounter (Signed)
Pt has been rescheduled to 10/21 at 7:30 AM.  CT is 10/14 at 8:00 AM He will go to [place] office for labs.   I will mail Instruction letters to patient.

## 2023-06-07 NOTE — Addendum Note (Signed)
Addended by: Cleda Mccreedy on: 06/07/2023 10:00 AM   Modules accepted: Orders

## 2023-06-10 ENCOUNTER — Ambulatory Visit: Payer: 59 | Admitting: Cardiology

## 2023-07-01 ENCOUNTER — Other Ambulatory Visit (HOSPITAL_COMMUNITY): Payer: Self-pay | Admitting: Internal Medicine

## 2023-07-08 LAB — CBC
Hematocrit: 41.6 % (ref 37.5–51.0)
Hemoglobin: 14 g/dL (ref 13.0–17.7)
MCH: 34 pg — ABNORMAL HIGH (ref 26.6–33.0)
MCHC: 33.7 g/dL (ref 31.5–35.7)
MCV: 101 fL — ABNORMAL HIGH (ref 79–97)
Platelets: 322 10*3/uL (ref 150–450)
RBC: 4.12 x10E6/uL — ABNORMAL LOW (ref 4.14–5.80)
RDW: 11.9 % (ref 11.6–15.4)
WBC: 7.6 10*3/uL (ref 3.4–10.8)

## 2023-07-09 LAB — BASIC METABOLIC PANEL
BUN/Creatinine Ratio: 16 (ref 10–24)
BUN: 13 mg/dL (ref 8–27)
CO2: 22 mmol/L (ref 20–29)
Calcium: 9.4 mg/dL (ref 8.6–10.2)
Chloride: 93 mmol/L — ABNORMAL LOW (ref 96–106)
Creatinine, Ser: 0.81 mg/dL (ref 0.76–1.27)
Glucose: 111 mg/dL — ABNORMAL HIGH (ref 70–99)
Potassium: 4.3 mmol/L (ref 3.5–5.2)
Sodium: 131 mmol/L — ABNORMAL LOW (ref 134–144)
eGFR: 100 mL/min/{1.73_m2} (ref >59)

## 2023-07-12 ENCOUNTER — Telehealth (HOSPITAL_COMMUNITY): Payer: Self-pay | Admitting: *Deleted

## 2023-07-12 NOTE — Telephone Encounter (Signed)
Reaching out to patient to offer assistance regarding upcoming cardiac imaging study; pt verbalizes understanding of appt date/time, parking situation and where to check in, pre-test NPO status, and verified current allergies; name and call back number provided for further questions should they arise  Larey Brick RN Navigator Cardiac Imaging [facility] [phone] office [phone] cell  Patient to take his daily medications. He is aware to arrive at 7:30 AM.

## 2023-07-15 ENCOUNTER — Ambulatory Visit (HOSPITAL_COMMUNITY)
Admission: RE | Admit: 2023-07-15 | Discharge: 2023-07-15 | Disposition: A | Discharge status: Home / Self Care | Payer: 59 | Source: Ambulatory Visit | Attending: Cardiology | Admitting: Cardiology

## 2023-07-15 DIAGNOSIS — I480 Paroxysmal atrial fibrillation: Secondary | ICD-10-CM | POA: Diagnosis not present

## 2023-07-15 MED ORDER — IOHEXOL 350 MG/ML SOLN
100.0000 mL | Freq: Once | INTRAVENOUS | Status: AC | PRN
  Administered 2023-07-15: 100 mL via INTRAVENOUS

## 2023-07-19 NOTE — Pre-Procedure Instructions (Signed)
Instructed patient on the following items: Arrival time 0515 Nothing to eat or drink after midnight No meds AM of procedure Responsible person to drive you home and stay with you for 24 hrs  Have you missed any doses of anti-coagulant Eliquis- takes twice a day, hasn't missed any doses.  Don't take dose on Monday.

## 2023-07-21 NOTE — Anesthesia Preprocedure Evaluation (Signed)
Anesthesia Evaluation  Patient identified by MRN, date of birth, ID band Patient awake    Reviewed: Allergy & Precautions, NPO status , Patient's Chart, lab work & pertinent test results  History of Anesthesia Complications Negative for: history of anesthetic complications  Airway Mallampati: II  TM Distance: >3 FB Neck ROM: Full    Dental no notable dental hx.    Pulmonary Current Smoker and Patient abstained from smoking.   Pulmonary exam normal        Cardiovascular hypertension, Pt. on medications and Pt. on home beta blockers + CAD  Normal cardiovascular exam+ dysrhythmias Atrial Fibrillation   TTE 11/2022: EF 60-65%, moderate LVH, mild pHTN, mild MR   Neuro/Psych negative neurological ROS  negative psych ROS   GI/Hepatic negative GI ROS, Neg liver ROS,,,  Endo/Other  negative endocrine ROS    Renal/GU negative Renal ROS  negative genitourinary   Musculoskeletal  (+) Arthritis ,    Abdominal   Peds  Hematology negative hematology ROS (+)   Anesthesia Other Findings Day of surgery medications reviewed with patient.  Reproductive/Obstetrics                              Anesthesia Physical Anesthesia Plan  ASA: 3  Anesthesia Plan: General   Post-op Pain Management: Minimal or no pain anticipated   Induction: Intravenous  PONV Risk Score and Plan: 2 and Treatment may vary due to age or medical condition, Ondansetron, Dexamethasone and Midazolam  Airway Management Planned: Oral ETT  Additional Equipment: None  Intra-op Plan:   Post-operative Plan: Extubation in OR  Informed Consent: I have reviewed the patients History and Physical, chart, labs and discussed the procedure including the risks, benefits and alternatives for the proposed anesthesia with the patient or authorized representative who has indicated his/her understanding and acceptance.     Dental advisory  given  Plan Discussed with: CRNA  Anesthesia Plan Comments:          Anesthesia Quick Evaluation

## 2023-07-22 ENCOUNTER — Ambulatory Visit (HOSPITAL_COMMUNITY): Payer: Self-pay | Admitting: Anesthesiology

## 2023-07-22 ENCOUNTER — Other Ambulatory Visit: Payer: Self-pay

## 2023-07-22 ENCOUNTER — Encounter (HOSPITAL_COMMUNITY)
Admission: RE | Disposition: A | Discharge status: Home / Self Care | Payer: Self-pay | Source: Home / Self Care | Attending: Cardiology

## 2023-07-22 ENCOUNTER — Ambulatory Visit (HOSPITAL_COMMUNITY)
Admission: RE | Admit: 2023-07-22 | Discharge: 2023-07-22 | Disposition: A | Discharge status: Home / Self Care | Payer: 59 | Attending: Cardiology | Admitting: Cardiology

## 2023-07-22 ENCOUNTER — Ambulatory Visit (HOSPITAL_BASED_OUTPATIENT_CLINIC_OR_DEPARTMENT_OTHER): Payer: Self-pay | Admitting: Anesthesiology

## 2023-07-22 DIAGNOSIS — F17200 Nicotine dependence, unspecified, uncomplicated: Secondary | ICD-10-CM | POA: Diagnosis not present

## 2023-07-22 DIAGNOSIS — Z8249 Family history of ischemic heart disease and other diseases of the circulatory system: Secondary | ICD-10-CM | POA: Diagnosis not present

## 2023-07-22 DIAGNOSIS — I480 Paroxysmal atrial fibrillation: Secondary | ICD-10-CM | POA: Insufficient documentation

## 2023-07-22 DIAGNOSIS — I10 Essential (primary) hypertension: Secondary | ICD-10-CM | POA: Insufficient documentation

## 2023-07-22 DIAGNOSIS — I4891 Unspecified atrial fibrillation: Secondary | ICD-10-CM

## 2023-07-22 DIAGNOSIS — I2510 Atherosclerotic heart disease of native coronary artery without angina pectoris: Secondary | ICD-10-CM | POA: Diagnosis not present

## 2023-07-22 HISTORY — PX: ATRIAL FIBRILLATION ABLATION: EP1191

## 2023-07-22 LAB — POCT ACTIVATED CLOTTING TIME: Activated Clotting Time: 336 s

## 2023-07-22 SURGERY — ATRIAL FIBRILLATION ABLATION
Anesthesia: General

## 2023-07-22 MED ORDER — PROPOFOL 10 MG/ML IV BOLUS
INTRAVENOUS | Status: DC | PRN
  Administered 2023-07-22: 160 mg via INTRAVENOUS

## 2023-07-22 MED ORDER — SODIUM CHLORIDE 0.9 % IV SOLN: INTRAVENOUS | Status: DC

## 2023-07-22 MED ORDER — ONDANSETRON HCL 4 MG/2ML IJ SOLN
INTRAMUSCULAR | Status: DC | PRN
  Administered 2023-07-22: 4 mg via INTRAVENOUS

## 2023-07-22 MED ORDER — MIDAZOLAM HCL 2 MG/2ML IJ SOLN
INTRAMUSCULAR | Status: AC
  Filled 2023-07-22: qty 2

## 2023-07-22 MED ORDER — ONDANSETRON HCL 4 MG/2ML IJ SOLN: 4.0000 mg | Freq: Four times a day (QID) | INTRAMUSCULAR | Status: DC | PRN

## 2023-07-22 MED ORDER — SODIUM CHLORIDE 0.9% FLUSH: 10.0000 mL | Freq: Two times a day (BID) | INTRAVENOUS | Status: DC

## 2023-07-22 MED ORDER — ACETAMINOPHEN 500 MG PO TABS
1000.0000 mg | ORAL_TABLET | Freq: Once | ORAL | Status: AC
  Administered 2023-07-22: 1000 mg via ORAL
  Filled 2023-07-22: qty 2

## 2023-07-22 MED ORDER — PROTAMINE SULFATE 10 MG/ML IV SOLN
INTRAVENOUS | Status: DC | PRN
  Administered 2023-07-22: 40 mg via INTRAVENOUS

## 2023-07-22 MED ORDER — ATROPINE SULFATE 1 MG/10ML IJ SOSY
PREFILLED_SYRINGE | INTRAMUSCULAR | Status: AC
  Filled 2023-07-22: qty 10

## 2023-07-22 MED ORDER — HEPARIN (PORCINE) IN NACL 2000-0.9 UNIT/L-% IV SOLN
INTRAVENOUS | Status: DC | PRN
  Administered 2023-07-22: 1000 mL

## 2023-07-22 MED ORDER — MIDAZOLAM HCL 2 MG/2ML IJ SOLN
INTRAMUSCULAR | Status: DC | PRN
  Administered 2023-07-22: 2 mg via INTRAVENOUS

## 2023-07-22 MED ORDER — ACETAMINOPHEN 325 MG PO TABS
650.0000 mg | ORAL_TABLET | ORAL | Status: DC | PRN
Start: 2023-07-22 — End: 2023-07-22

## 2023-07-22 MED ORDER — SUGAMMADEX SODIUM 200 MG/2ML IV SOLN
INTRAVENOUS | Status: DC | PRN
  Administered 2023-07-22: 400 mg via INTRAVENOUS

## 2023-07-22 MED ORDER — SODIUM CHLORIDE 0.9% FLUSH: 3.0000 mL | Freq: Two times a day (BID) | INTRAVENOUS | Status: DC

## 2023-07-22 MED ORDER — HEPARIN SODIUM (PORCINE) 1000 UNIT/ML IJ SOLN
INTRAMUSCULAR | Status: DC | PRN
  Administered 2023-07-22: 15000 [IU] via INTRAVENOUS
  Administered 2023-07-22: 1000 [IU] via INTRAVENOUS

## 2023-07-22 MED ORDER — PHENYLEPHRINE HCL-NACL 20-0.9 MG/250ML-% IV SOLN
INTRAVENOUS | Status: DC | PRN
  Administered 2023-07-22: 80 ug/min via INTRAVENOUS

## 2023-07-22 MED ORDER — FENTANYL CITRATE (PF) 250 MCG/5ML IJ SOLN
INTRAMUSCULAR | Status: DC | PRN
  Administered 2023-07-22 (×2): 50 ug via INTRAVENOUS

## 2023-07-22 MED ORDER — DEXAMETHASONE SODIUM PHOSPHATE 10 MG/ML IJ SOLN
INTRAMUSCULAR | Status: DC | PRN
  Administered 2023-07-22: 5 mg via INTRAVENOUS

## 2023-07-22 MED ORDER — LIDOCAINE 2% (20 MG/ML) 5 ML SYRINGE
INTRAMUSCULAR | Status: DC | PRN
  Administered 2023-07-22: 60 mg via INTRAVENOUS

## 2023-07-22 MED ORDER — EPHEDRINE SULFATE-NACL 50-0.9 MG/10ML-% IV SOSY
PREFILLED_SYRINGE | INTRAVENOUS | Status: DC | PRN
  Administered 2023-07-22 (×2): 10 mg via INTRAVENOUS
  Administered 2023-07-22: 5 mg via INTRAVENOUS

## 2023-07-22 MED ORDER — SODIUM CHLORIDE 0.9% FLUSH: 3.0000 mL | INTRAVENOUS | Status: DC | PRN

## 2023-07-22 MED ORDER — FENTANYL CITRATE (PF) 100 MCG/2ML IJ SOLN
INTRAMUSCULAR | Status: AC
  Filled 2023-07-22: qty 2

## 2023-07-22 MED ORDER — ATROPINE SULFATE 1 MG/10ML IJ SOSY
PREFILLED_SYRINGE | INTRAMUSCULAR | Status: DC | PRN
  Administered 2023-07-22: 1 mg via INTRAVENOUS

## 2023-07-22 MED ORDER — ROCURONIUM BROMIDE 10 MG/ML (PF) SYRINGE
PREFILLED_SYRINGE | INTRAVENOUS | Status: DC | PRN
  Administered 2023-07-22: 30 mg via INTRAVENOUS
  Administered 2023-07-22: 50 mg via INTRAVENOUS
  Administered 2023-07-22: 20 mg via INTRAVENOUS

## 2023-07-22 MED ORDER — HEPARIN (PORCINE) IN NACL 1000-0.9 UT/500ML-% IV SOLN
INTRAVENOUS | Status: DC | PRN
  Administered 2023-07-22: 500 mL

## 2023-07-22 SURGICAL SUPPLY — 23 items
BAG SNAP BAND KOVER 36X36 (MISCELLANEOUS) IMPLANT
BLANKET WARM UNDERBOD FULL ACC (MISCELLANEOUS) ×1 IMPLANT
CABLE PFA RX CATH CONN (CABLE) IMPLANT
CATH 8FR REPROCESSED SOUNDSTAR (CATHETERS) ×1 IMPLANT
CATH 8FR SOUNDSTAR REPROCESSED (CATHETERS) IMPLANT
CATH FARAWAVE ABLATION 31 (CATHETERS) IMPLANT
CATH OCTARAY 2.0 F 3-3-3-3-3 (CATHETERS) IMPLANT
CATH WEB BI DIR CSDF CRV REPRO (CATHETERS) IMPLANT
CLOSURE MYNX CONTROL 6F/7F (Vascular Products) IMPLANT
CLOSURE PERCLOSE PROSTYLE (VASCULAR PRODUCTS) IMPLANT
COVER SWIFTLINK CONNECTOR (BAG) ×1 IMPLANT
DILATOR VESSEL 38 20CM 16FR (INTRODUCER) IMPLANT
GUIDEWIRE INQWIRE 1.5J.035X260 (WIRE) IMPLANT
INQWIRE 1.5J .035X260CM (WIRE) ×1
PACK EP LATEX FREE (CUSTOM PROCEDURE TRAY) ×1
PACK EP LF (CUSTOM PROCEDURE TRAY) ×1 IMPLANT
PAD DEFIB RADIO PHYSIO CONN (PAD) ×1 IMPLANT
PATCH CARTO3 (PAD) IMPLANT
SHEATH FARADRIVE STEERABLE (SHEATH) IMPLANT
SHEATH PINNACLE 8F 10CM (SHEATH) IMPLANT
SHEATH PINNACLE 9F 10CM (SHEATH) IMPLANT
SHEATH PROBE COVER 6X72 (BAG) IMPLANT
SHEATH WIRE KIT BAYLIS SL1 (KITS) IMPLANT

## 2023-07-22 NOTE — Transfer of Care (Signed)
Immediate Anesthesia Transfer of Care Note  Patient: Tirth Meche  Procedure(s) Performed: ATRIAL FIBRILLATION ABLATION  Patient Location: PACU  Anesthesia Type:General  Level of Consciousness: awake, alert , oriented, and patient cooperative  Airway & Oxygen Therapy: Patient Spontanous Breathing  Post-op Assessment: Report given to RN and Post -op Vital signs reviewed and stable  Post vital signs: Reviewed and stable  Last Vitals:  Vitals Value Taken Time  BP    Temp    Pulse 73 07/22/23 0928  Resp 12 07/22/23 0928  SpO2 96 % 07/22/23 0928  Vitals shown include unfiled device data.  Last Pain:  Vitals:   07/22/23 0642  PainSc: 0-No pain         Complications: There were no known notable events for this encounter.

## 2023-07-22 NOTE — H&P (Signed)
Electrophysiology Office Note   Date:  07/22/2023   ID:  Jermaine Rose, DOB Nov 03, 1960, MRN [id number]  PCP:  Julianne Handler, NP  Cardiologist:  Bing Matter Primary Electrophysiologist:  Regan Lemming, MD    Chief Complaint: AF   History of Present Illness: Jermaine Rose is a 62 y.o. male who is being seen today for the evaluation of AF at the request of No ref. provider found. Presenting today for electrophysiology evaluation.  He has a history seen for atrial fibrillation, coronary artery disease, hypertension.  Poorly when he is in atrial fibrillation.  He has significant weakness, fatigue, shortness of breath.  He finds it difficult to do all of his daily activities due to his atrial fibrillation.  When he is in normal rhythm he feels well.  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  He has been having episodes that are more prolonged over the last year.  Today, denies symptoms of palpitations, chest pain, shortness of breath, orthopnea, PND, lower extremity edema, claudication, dizziness, presyncope, syncope, bleeding, or neurologic sequela. The patient is tolerating medications without difficulties. Plan ablation today.    Past Medical History:  Diagnosis Date   Arthritis    Atrial fibrillation (HCC)    Bunionette of right foot 05/23/2018   Carpal tunnel syndrome    Cigarette smoker 08/19/2018   Coronary artery disease 05/10/2021   Dyspnea on exertion 08/19/2018   External hemorrhoids    Foot pain    Hypertension 08/13/2018   Hyponatremia    MRSA infection    6-8 years ago in his neck per patient   Numbness and tingling in both hands    Osteochondroma    PAF (paroxysmal atrial fibrillation) (HCC) 03/09/2021   Peroneal tendonitis, right 03/12/2017   Primary osteoarthritis of both first carpometacarpal joints 02/15/2020   Syncope 08/13/2018   had Cath at [facility] but no follow up needed per patient   Unspecified atrial fibrillation (HCC)  12/30/2020   URI (upper respiratory infection) 08/13/2018   Past Surgical History:  Procedure Laterality Date   ANKLE SURGERY Right 2008,2009   CARPAL TUNNEL RELEASE Right 05/02/2020   Procedure: CARPAL TUNNEL RELEASE;  Surgeon: Betha Loa, MD;  Location: [facility];  Service: Orthopedics;  Laterality: Right;   CARPAL TUNNEL RELEASE Left 09/27/2020   Procedure: CARPAL TUNNEL RELEASE;  Surgeon: Betha Loa, MD;  Location: [facility];  Service: Orthopedics;  Laterality: Left;  Bier block   KNEE SURGERY Bilateral    Left in 2007 and Right in 1981     Current Facility-Administered Medications  Medication Dose Route Frequency Provider Last Rate Last Admin   0.9 %  sodium chloride infusion   Intravenous Continuous Regan Lemming, MD 50 mL/hr at 07/22/23 0646 New Bag at 07/22/23 0646    Allergies:   Lisinopril   Social History:  The patient  reports that he has been smoking cigarettes. He has a 15 pack-year smoking history. He has never used smokeless tobacco. He reports current alcohol use of about 7.0 - 14.0 standard drinks of alcohol per week. He reports that he does not currently use drugs.   Family History:  The patient's family history includes Atrial fibrillation in his father and mother; Breast cancer in his mother; CAD in his father; Heart attack in his father; Hypertension in his father.   ROS:  Please see the history of present illness.   Otherwise, review of systems is  positive for none.   All other systems are reviewed and negative.   PHYSICAL EXAM: VS:  There were no vitals taken for this visit. , BMI There is no height or weight on file to calculate BMI. GEN: Well nourished, well developed, in no acute distress  HEENT: normal  Neck: no JVD, carotid bruits, or masses Cardiac: RRR; no murmurs, rubs, or gallops,no edema  Respiratory:  clear to auscultation bilaterally, normal work of breathing GI: soft, nontender, nondistended, + BS MS: no  deformity or atrophy  Skin: warm and dry Neuro:  Strength and sensation are intact Psych: euthymic mood, full affect  Recent Labs: 07/08/2023: BUN 13; Creatinine, Ser 0.81; Hemoglobin 14.0; Platelets 322; Potassium 4.3; Sodium 131    Lipid Panel     Component Value Date/Time   CHOL 143 08/14/2021 0828   TRIG 32 08/14/2021 0828   HDL 90 08/14/2021 0828   CHOLHDL 1.6 08/14/2021 0828   LDLCALC 44 08/14/2021 0828     Wt Readings from Last 3 Encounters:  03/08/23 88.5 kg  01/28/23 87.9 kg  01/07/23 87.1 kg      Other studies Reviewed: Additional studies/ records that were reviewed today include: TTE 12/19/22  Review of the above records today demonstrates:   1. Left ventricular ejection fraction, by estimation, is 60 to 65%. The  left ventricle has normal function. The left ventricle has no regional  wall motion abnormalities. There is moderate left ventricular hypertrophy.  Left ventricular diastolic  parameters are indeterminate.   2. Right ventricular systolic function is normal. The right ventricular  size is normal. There is mildly elevated pulmonary artery systolic  pressure.   3. The mitral valve is normal in structure. Mild mitral valve  regurgitation. No evidence of mitral stenosis.   4. The aortic valve is normal in structure. Aortic valve regurgitation is  not visualized. No aortic stenosis is present.   5. The inferior vena cava is normal in size with greater than 50%  respiratory variability, suggesting right atrial pressure of 3 mmHg.    ASSESSMENT AND PLAN:  1.  Paroxysmal atrial fibrillation: Elyas Serpa has presented today for surgery, with the diagnosis of AF.  The various methods of treatment have been discussed with the patient and family. After consideration of risks, benefits and other options for treatment, the patient has consented to  Procedure(s): Catheter ablation as a surgical intervention .  Risks include but not limited to complete heart block,  stroke, esophageal damage, nerve damage, bleeding, vascular damage, tamponade, perforation, MI, and death. The patient's history has been reviewed, patient examined, no change in status, stable for surgery.  I have reviewed the patient's chart and labs.  Questions were answered to the patient's satisfaction.    Annet Manukyan Elberta Fortis, MD 07/22/2023 7:03 AM

## 2023-07-22 NOTE — Anesthesia Procedure Notes (Signed)
Procedure Name: Intubation Date/Time: 07/22/2023 7:52 AM  Performed by: Stanton Kidney, CRNAPre-anesthesia Checklist: Patient identified, Patient being monitored, Timeout performed, Emergency Drugs available and Suction available Patient Re-evaluated:Patient Re-evaluated prior to induction Oxygen Delivery Method: Circle system utilized Preoxygenation: Pre-oxygenation with 100% oxygen Induction Type: IV induction Ventilation: Mask ventilation without difficulty Laryngoscope Size: Mac and 3 Grade View: Grade I Tube type: Oral Tube size: 7.5 mm Number of attempts: 1 Airway Equipment and Method: Stylet Placement Confirmation: ETT inserted through vocal cords under direct vision, positive ETCO2 and breath sounds checked- equal and bilateral Secured at: 22 cm Tube secured with: Tape Dental Injury: Teeth and Oropharynx as per pre-operative assessment

## 2023-07-22 NOTE — Anesthesia Postprocedure Evaluation (Signed)
Anesthesia Post Note  Patient: Jermaine Rose  Procedure(s) Performed: ATRIAL FIBRILLATION ABLATION     Patient location during evaluation: PACU Anesthesia Type: General Level of consciousness: awake and alert Pain management: pain level controlled Vital Signs Assessment: post-procedure vital signs reviewed and stable Respiratory status: spontaneous breathing, nonlabored ventilation and respiratory function stable Cardiovascular status: blood pressure returned to baseline Postop Assessment: no apparent nausea or vomiting Anesthetic complications: no   There were no known notable events for this encounter.  Last Vitals:  Vitals:   07/22/23 0940 07/22/23 0945  BP: 125/76 115/82  Pulse: 72 70  Resp: 12 15  Temp:    SpO2: 97% 93%    Last Pain:  Vitals:   07/22/23 0931  TempSrc: Temporal  PainSc:                  Shanda Howells

## 2023-07-22 NOTE — Addendum Note (Signed)
Addendum  created 07/22/23 1048 by Stanton Kidney, CRNA   Intraprocedure Meds edited

## 2023-07-22 NOTE — Discharge Instructions (Signed)

## 2023-07-23 ENCOUNTER — Encounter (HOSPITAL_COMMUNITY): Payer: Self-pay | Admitting: Cardiology

## 2023-08-19 ENCOUNTER — Ambulatory Visit (HOSPITAL_COMMUNITY)
Admission: RE | Admit: 2023-08-19 | Discharge: 2023-08-19 | Disposition: A | Discharge status: Home / Self Care | Payer: 59 | Source: Ambulatory Visit | Attending: Physician Assistant | Admitting: Physician Assistant

## 2023-08-19 VITALS — BP 132/74 | HR 68 | Ht 73.0 in | Wt 201.8 lb

## 2023-08-19 DIAGNOSIS — Z79899 Other long term (current) drug therapy: Secondary | ICD-10-CM

## 2023-08-19 DIAGNOSIS — I480 Paroxysmal atrial fibrillation: Secondary | ICD-10-CM

## 2023-08-19 DIAGNOSIS — I2510 Atherosclerotic heart disease of native coronary artery without angina pectoris: Secondary | ICD-10-CM | POA: Diagnosis not present

## 2023-08-19 DIAGNOSIS — Z5181 Encounter for therapeutic drug level monitoring: Secondary | ICD-10-CM

## 2023-08-19 DIAGNOSIS — I10 Essential (primary) hypertension: Secondary | ICD-10-CM | POA: Insufficient documentation

## 2023-08-19 DIAGNOSIS — D6869 Other thrombophilia: Secondary | ICD-10-CM

## 2023-08-19 DIAGNOSIS — Z720 Tobacco use: Secondary | ICD-10-CM | POA: Diagnosis not present

## 2023-08-19 DIAGNOSIS — Z7901 Long term (current) use of anticoagulants: Secondary | ICD-10-CM | POA: Diagnosis not present

## 2023-08-19 NOTE — Progress Notes (Signed)
Primary Care Physician: Julianne Handler, NP Primary Cardiologist: Dr. Tomie China Primary Electrophysiologist: Dr. Elberta Fortis Referring Physician: Dr. Myrtie Cruise Jermaine Rose is a 62 y.o. male with a history of tobacco use, HTN, CAD, and paroxysmal atrial fibrillation who presents for consultation in the [facility].  Patient seen by Dr. Estill Dooms on 01/28/23 and scheduled for ablation 05/22/23. He contacted office noting he has been in Afib since 6/2. Patient is on Eliquis 5 mg BID for a CHADS2VASC score of 2. He was started on amiodarone as a bridge to ablation.   On follow up today, patient is s/p afib ablation with Dr Elberta Fortis on 07/22/23. Patient reports that he has done well from a cardiac standpoint. He has not had any interim symptoms of afib. He denies chest pain or groin issues.  Today, he denies symptoms of palpitations, chest pain, shortness of breath, orthopnea, PND, lower extremity edema, dizziness, presyncope, syncope, snoring, daytime somnolence, bleeding, or neurologic sequela. The patient is tolerating medications without difficulties and is otherwise without complaint today.     Atrial Fibrillation Management history:  Previous antiarrhythmic drugs: amiodarone  Previous cardioversions: likely 12/21/22 [facility] hospital) Previous ablations: 07/22/23 Anticoagulation history: Eliquis 5 mg BID   Past Medical History:  Diagnosis Date   Arthritis    Atrial fibrillation (HCC)    Bunionette of right foot 05/23/2018   Carpal tunnel syndrome    Cigarette smoker 08/19/2018   Coronary artery disease 05/10/2021   Dyspnea on exertion 08/19/2018   External hemorrhoids    Foot pain    Hypertension 08/13/2018   Hyponatremia    MRSA infection    6-8 years ago in his neck per patient   Numbness and tingling in both hands    Osteochondroma    PAF (paroxysmal atrial fibrillation) (HCC) 03/09/2021   Peroneal tendonitis, right 03/12/2017   Primary osteoarthritis of  both first carpometacarpal joints 02/15/2020   Syncope 08/13/2018   had Cath at [place] but no follow up needed per patient   Unspecified atrial fibrillation (HCC) 12/30/2020   URI (upper respiratory infection) 08/13/2018    Current Outpatient Medications  Medication Sig Dispense Refill   amiodarone (PACERONE) 200 MG tablet Take 1 tablet (200 mg total) by mouth daily. 30 tablet 3   amLODipine (NORVASC) 10 MG tablet Take 1 tablet (10 mg total) by mouth in the morning. 90 tablet 3   apixaban (ELIQUIS) 5 MG TABS tablet TAKE ONE TABLET BY MOUTH TWICE DAILY 180 tablet 1   aspirin EC 81 MG tablet Take 1 tablet (81 mg total) by mouth daily. Swallow whole. 90 tablet 3   Aspirin-Acetaminophen-Caffeine (GOODYS EXTRA STRENGTH PO) Take 1 packet by mouth as needed (pain.).     diltiazem (CARDIZEM CD) 180 MG 24 hr capsule Take 1 capsule (180 mg total) by mouth every morning. 90 capsule 1   fluorouracil (EFUDEX) 5 % cream Apply 1 Application topically 2 (two) times daily as needed.     indapamide (LOZOL) 2.5 MG tablet Take 2.5 mg by mouth every morning.     metoprolol succinate (TOPROL-XL) 100 MG 24 hr tablet Take 100 mg by mouth in the morning.     Multiple Vitamin (MULTIVITAMIN WITH MINERALS) TABS tablet Take 1 tablet by mouth in the morning.     Polyethyl Glycol-Propyl Glycol (LUBRICANT EYE DROPS) 0.4-0.3 % SOLN Place 1-2 drops into both eyes 3 (three) times daily as needed for dry eyes (dry/irritated eyes).     potassium chloride (  KLOR-CON) 10 MEQ tablet TAKE ONE TABLET BY MOUTH ONCE DAILY 30 tablet 6   psyllium (METAMUCIL) 58.6 % packet Take 1 packet by mouth in the morning.     rosuvastatin (CRESTOR) 10 MG tablet Take 10 mg by mouth in the morning.     tadalafil (CIALIS) 10 MG tablet Take 10 mg by mouth daily as needed for erectile dysfunction.     No current facility-administered medications for this encounter.    ROS- All systems are reviewed and negative except as per the HPI above.  Physical  Exam: Vitals:   08/19/23 1122  BP: 132/74  Pulse: 68  Weight: 91.5 kg  Height: 6\' 1"  (1.854 m)    GEN: Well nourished, well developed in no acute distress NECK: No JVD; No carotid bruits CARDIAC: Regular rate and rhythm, no murmurs, rubs, gallops RESPIRATORY:  Clear to auscultation without rales, wheezing or rhonchi  ABDOMEN: Soft, non-tender, non-distended EXTREMITIES:  No edema; No deformity    Wt Readings from Last 3 Encounters:  08/19/23 91.5 kg  03/08/23 88.5 kg  01/28/23 87.9 kg    EKG today demonstrates  SR Vent. rate 68 BPM PR interval 172 ms QRS duration 102 ms QT/QTcB 418/444 ms  Echo 12/19/22 demonstrated:  1. Left ventricular ejection fraction, by estimation, is 60 to 65%. The  left ventricle has normal function. The left ventricle has no regional  wall motion abnormalities. There is moderate left ventricular hypertrophy.  Left ventricular diastolic  parameters are indeterminate.   2. Right ventricular systolic function is normal. The right ventricular  size is normal. There is mildly elevated pulmonary artery systolic  pressure.   3. The mitral valve is normal in structure. Mild mitral valve  regurgitation. No evidence of mitral stenosis.   4. The aortic valve is normal in structure. Aortic valve regurgitation is  not visualized. No aortic stenosis is present.   5. The inferior vena cava is normal in size with greater than 50%  respiratory variability, suggesting right atrial pressure of 3 mmHg.   Epic records are reviewed at length today.  CHA2DS2-VASc Score = 2  The patient's score is based upon: CHF History: 0 HTN History: 1 Diabetes History: 0 Stroke History: 0 Vascular Disease History: 1 Age Score: 0 Gender Score: 0       ASSESSMENT AND PLAN: Paroxysmal Atrial Fibrillation (ICD10:  I48.0) The patient's CHA2DS2-VASc score is 2, indicating a 2.2% annual risk of stroke.   S/p afib ablation 07/22/23 Patient appears to be maintaining  SR Continue amiodarone 200 mg daily for now Continue Eliquis 5 mg BID with no missed doses for 3 months post ablation. Continue Toprol 100 mg daily Continue diltiazem 180 mg daily  Secondary Hypercoagulable State (ICD10:  D68.69) The patient is at significant risk for stroke/thromboembolism based upon his CHA2DS2-VASc Score of 2.  Continue Apixaban (Eliquis).   CAD CAC score 1985 On statin No anginal symptoms  HTN Stable on current regimen   Follow up with Dr Elberta Fortis as scheduled.    Jorja Loa PA-C Afib Clinic [facility] [address] [place], [place] [address] [phone] 08/19/2023 11:43 AM

## 2023-10-28 ENCOUNTER — Ambulatory Visit: Payer: 59 | Attending: Cardiology | Admitting: Cardiology

## 2023-10-28 ENCOUNTER — Other Ambulatory Visit (HOSPITAL_COMMUNITY): Payer: Self-pay | Admitting: Internal Medicine

## 2023-10-28 ENCOUNTER — Encounter: Payer: Self-pay | Admitting: Cardiology

## 2023-10-28 VITALS — BP 120/80 | HR 64 | Ht 73.0 in | Wt 201.0 lb

## 2023-10-28 DIAGNOSIS — I10 Essential (primary) hypertension: Secondary | ICD-10-CM

## 2023-10-28 DIAGNOSIS — I2510 Atherosclerotic heart disease of native coronary artery without angina pectoris: Secondary | ICD-10-CM

## 2023-10-28 DIAGNOSIS — D6869 Other thrombophilia: Secondary | ICD-10-CM

## 2023-10-28 DIAGNOSIS — I480 Paroxysmal atrial fibrillation: Secondary | ICD-10-CM

## 2023-10-28 NOTE — Patient Instructions (Signed)
Medication Instructions:  Your physician recommends that you continue on your current medications as directed. Please refer to the Current Medication list given to you today.  *If you need a refill on your cardiac medications before your next appointment, please call your pharmacy*   Lab Work: None ordered   Testing/Procedures: None ordered   Follow-Up: At [facility], you and your health needs are our priority.  As part of our continuing mission to provide you with exceptional heart care, we have created designated Provider Care Teams.  These Care Teams include your primary Cardiologist (physician) and Advanced Practice Providers (APPs -  Physician Assistants and Nurse Practitioners) who all work together to provide you with the care you need, when you need it.  Your next appointment:   6 month(s)  The format for your next appointment:   In Person  Provider:   Loman Brooklyn, MD    Thank you for choosing [facility]!!   Dory Horn, RN [phone]

## 2023-10-28 NOTE — Addendum Note (Signed)
Addended by: Baird Lyons on: 10/28/2023 11:08 AM   Modules accepted: Orders

## 2023-10-28 NOTE — Addendum Note (Signed)
Addended by: Baird Lyons on: 10/28/2023 01:29 PM   Modules accepted: Orders

## 2023-10-28 NOTE — Progress Notes (Signed)
  Electrophysiology Office Note:   Date:  10/28/2023  ID:  Elman Dettman, DOB October 04, 1960, MRN [id number]  Primary Cardiologist: Gypsy Balsam, MD Primary Heart Failure: None Electrophysiologist: Preciliano Castell Jorja Loa, MD      History of Present Illness:   Jermaine Rose is a 63 y.o. male with h/o atrial fibrillation, coronary artery disease, hypertension seen today for routine electrophysiology followup.   Since last being seen in our clinic the patient reports doing well.  He has noted no further episodes of atrial fibrillation since his ablation.  He continues to be quite active, doing his daily activities without restriction.  He is planning on surgery for his hip..  he denies chest pain, palpitations, dyspnea, PND, orthopnea, nausea, vomiting, dizziness, syncope, edema, weight gain, or early satiety.   Review of systems complete and found to be negative unless listed in HPI.   EP Information / Studies Reviewed:    EKG is ordered today. Personal review as below.        Risk Assessment/Calculations:    CHA2DS2-VASc Score = 2   This indicates a 2.2% annual risk of stroke. The patient's score is based upon: CHF History: 0 HTN History: 1 Diabetes History: 0 Stroke History: 0 Vascular Disease History: 1 Age Score: 0 Gender Score: 0            Physical Exam:   VS:  There were no vitals taken for this visit.   Wt Readings from Last 3 Encounters:  08/19/23 201 lb 12.8 oz (91.5 kg)  03/08/23 195 lb (88.5 kg)  01/28/23 193 lb 12.8 oz (87.9 kg)     GEN: Well nourished, well developed in no acute distress NECK: No JVD; No carotid bruits CARDIAC: Regular rate and rhythm, no murmurs, rubs, gallops RESPIRATORY:  Clear to auscultation without rales, wheezing or rhonchi  ABDOMEN: Soft, non-tender, non-distended EXTREMITIES:  No edema; No deformity   ASSESSMENT AND PLAN:    1.  Paroxysmal atrial fibrillation: Post ablation 07/22/2023.  He remains in sinus rhythm.  Hally Colella stop  amiodarone today.  2.  Secondary hypercoagulable state: Currently on Eliquis for atrial fibrillation  3.  Hypertension: Currently well-controlled  4.  Coronary artery disease: CT FFR negative..  Plan per primary cardiology.  5.  Preoperative evaluation: Planning surgery for his hip for a tumor removal.  He does have coronary artery disease, with negative CT FFR.  He would be able to stop anticoagulation around the time of surgery, but would need to restart postop.  He would be at intermediate risk for an intermediate risk procedure.  No further cardiac testing is necessary.  Follow up with Dr. Elberta Fortis in 6 months  Signed, Keenan Trefry Jorja Loa, MD

## 2024-01-09 ENCOUNTER — Other Ambulatory Visit: Payer: Self-pay

## 2024-01-13 ENCOUNTER — Encounter: Payer: Self-pay | Admitting: Cardiology

## 2024-01-13 ENCOUNTER — Ambulatory Visit: Payer: 59 | Attending: Cardiology | Admitting: Cardiology

## 2024-01-13 VITALS — BP 130/66 | HR 68 | Ht 73.0 in | Wt 202.0 lb

## 2024-01-13 DIAGNOSIS — I10 Essential (primary) hypertension: Secondary | ICD-10-CM | POA: Diagnosis not present

## 2024-01-13 DIAGNOSIS — E782 Mixed hyperlipidemia: Secondary | ICD-10-CM

## 2024-01-13 DIAGNOSIS — I2510 Atherosclerotic heart disease of native coronary artery without angina pectoris: Secondary | ICD-10-CM | POA: Diagnosis not present

## 2024-01-13 DIAGNOSIS — I480 Paroxysmal atrial fibrillation: Secondary | ICD-10-CM

## 2024-01-13 DIAGNOSIS — F17210 Nicotine dependence, cigarettes, uncomplicated: Secondary | ICD-10-CM

## 2024-01-13 NOTE — Patient Instructions (Signed)
 Medication Instructions:  Your physician recommends that you continue on your current medications as directed. Please refer to the Current Medication list given to you today.  *If you need a refill on your cardiac medications before your next appointment, please call your pharmacy*   Lab Work: None Ordered If you have labs (blood work) drawn today and your tests are completely normal, you will receive your results only by: MyChart Message (if you have MyChart) OR A paper copy in the mail If you have any lab test that is abnormal or we need to change your treatment, we will call you to review the results.   Testing/Procedures: None Ordered   Follow-Up: At [facility], you and your health needs are our priority.  As part of our continuing mission to provide you with exceptional heart care, we have created designated Provider Care Teams.  These Care Teams include your primary Cardiologist (physician) and Advanced Practice Providers (APPs -  Physician Assistants and Nurse Practitioners) who all work together to provide you with the care you need, when you need it.  We recommend signing up for the patient portal called "MyChart".  Sign up information is provided on this After Visit Summary.  MyChart is used to connect with patients for Virtual Visits (Telemedicine).  Patients are able to view lab/test results, encounter notes, upcoming appointments, etc.  Non-urgent messages can be sent to your provider as well.   To learn more about what you can do with MyChart, go to [email or web address].    Your next appointment:   9 month follow up
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
## 2024-07-02 ENCOUNTER — Telehealth: Payer: Self-pay

## 2024-07-02 NOTE — Telephone Encounter (Signed)
   Patient Name: Jermaine Rose  DOB: 1960-12-20 MRN: [id number]  Primary Cardiologist: Lamar Fitch, MD  Chart reviewed as part of pre-operative protocol coverage.    Simple dental extractions (i.e. 1-2 teeth) are considered low risk procedures per guidelines and generally do not require any specific cardiac clearance. It is also generally accepted that for simple extractions and dental cleanings, there is no need to interrupt blood thinner therapy.  SBE prophylaxis is not required for the patient from a cardiac standpoint.  I will route this recommendation to the requesting party via Epic fax function and remove from pre-op pool.  Please call with questions.  Wyn Raddle, Jackee Shove, NP 07/02/2024, 8:09 AM

## 2024-07-02 NOTE — Telephone Encounter (Signed)
   Pre-operative Risk Assessment    Patient Name: Jermaine Rose  DOB: 05-13-1961 MRN: [id number]   Date of last office visit: 01/13/24 Date of next office visit: N/A  Request for Surgical Clearance    Procedure:  Dental Extraction - Amount of Teeth to be Pulled:  2  Date of Surgery:  Clearance TBD                                Surgeon:   Surgeon's Group or Practice Name:  [facility] Phone number:  [phone] Fax number:  [phone]   Type of Clearance Requested:   - Pharmacy:  Hold Apixaban  (Eliquis ) please advise   Type of Anesthesia:  Local    Additional requests/questions:    SignedAnnabella LITTIE Sayres   07/02/2024, 7:49 AM

## 2024-09-28 ENCOUNTER — Encounter: Payer: Self-pay | Admitting: Cardiology

## 2024-09-28 ENCOUNTER — Ambulatory Visit: Attending: Cardiology | Admitting: Cardiology

## 2024-09-28 VITALS — BP 130/80 | HR 74 | Ht 73.0 in | Wt 201.6 lb

## 2024-09-28 DIAGNOSIS — I2510 Atherosclerotic heart disease of native coronary artery without angina pectoris: Secondary | ICD-10-CM | POA: Diagnosis not present

## 2024-09-28 DIAGNOSIS — E782 Mixed hyperlipidemia: Secondary | ICD-10-CM

## 2024-09-28 DIAGNOSIS — I480 Paroxysmal atrial fibrillation: Secondary | ICD-10-CM | POA: Diagnosis not present

## 2024-09-28 DIAGNOSIS — I10 Essential (primary) hypertension: Secondary | ICD-10-CM | POA: Diagnosis not present

## 2024-09-28 DIAGNOSIS — F17210 Nicotine dependence, cigarettes, uncomplicated: Secondary | ICD-10-CM | POA: Diagnosis not present

## 2024-09-28 NOTE — Progress Notes (Signed)
 " Cardiology Office Note:    Date:  09/28/2024   ID:  Jermaine Rose, DOB 1961/08/11, MRN [id number]  PCP:  Benjamine Lauraine DASEN, NP  Cardiologist:  Jennifer JONELLE Crape, MD   Referring MD: Benjamine Lauraine DASEN, NP    ASSESSMENT:    1. Mixed dyslipidemia   2. PAF (paroxysmal atrial fibrillation) (HCC)   3. Primary hypertension   4. Coronary artery disease involving native coronary artery of native heart without angina pectoris   5. Cigarette smoker    PLAN:    In order of problems listed above:  Coronary artery disease: Secondary prevention stressed with the patient.  Importance of compliance with diet medication stressed and patient verbalized standing. He was advised to walk at least half an hour a day on a daily basis. Paroxysmal atrial fibrillation post ablation: Clinically has had no recurrences.  I told him to get an appointment with Dr. Inocencio for follow-up about questions about continued anticoagulation.  If he needs anticoagulation then he might be a candidate for Watchman device.  He is agreeable. Essential hypertension: Blood pressure is stable and diet was emphasized. Mixed dyslipidemia: On lipid-lowering medications and lipids are at goal and I reviewed records obtained from primary care. Cigarette smoker: I spent 5 minutes with the patient discussing solely about smoking. Smoking cessation was counseled. I suggested to the patient also different medications and pharmacological interventions. Patient is keen to try stopping on its own at this time. He will get back to me if he needs any further assistance in this matter. Patient will be seen in follow-up appointment in 6 months or earlier if the patient has any concerns.    Medication Adjustments/Labs and Tests Ordered: Current medicines are reviewed at length with the patient today.  Concerns regarding medicines are outlined above.  Orders Placed This Encounter  Procedures   EKG 12-Lead   No orders of the defined types were placed  in this encounter.    No chief complaint on file.    History of Present Illness:    Jermaine Rose is a 63 y.o. male.  Patient has past medical history of paroxysmal atrial fibrillation post ablation, essential hypertension, mixed dyslipidemia and smoking.  Unfortunately continues to smoke.  No chest pain orthopnea or PND.  At the time of my evaluation, the patient is alert awake oriented and in no distress.  Past Medical History:  Diagnosis Date   Arthritis    Atrial fibrillation (HCC)    Bunionette of right foot 05/23/2018   Carpal tunnel syndrome    Cigarette smoker 08/19/2018   Coronary artery disease 05/10/2021   Dyspnea on exertion 08/19/2018   External hemorrhoids    Foot pain    Hypercoagulable state due to paroxysmal atrial fibrillation (HCC) 03/08/2023   Hypertension 08/13/2018   Hyponatremia    Mixed dyslipidemia 01/13/2024   MRSA infection    6-8 years ago in his neck per patient   Numbness and tingling in both hands    Osteochondroma    PAF (paroxysmal atrial fibrillation) (HCC) 03/09/2021   Peroneal tendonitis, right 03/12/2017   Primary osteoarthritis of both first carpometacarpal joints 02/15/2020   Syncope 08/13/2018   had Cath at [facility] but no follow up needed per patient   Unspecified atrial fibrillation (HCC) 12/30/2020   URI (upper respiratory infection) 08/13/2018    Past Surgical History:  Procedure Laterality Date   ANKLE SURGERY Right 2008,2009   ATRIAL FIBRILLATION ABLATION N/A 07/22/2023   Procedure: ATRIAL FIBRILLATION ABLATION;  Surgeon: Inocencio Soyla Lunger, MD;  Location: [facility] INVASIVE CV [facility];  Service: Cardiovascular;  Laterality: N/A;   CARPAL TUNNEL RELEASE Right 05/02/2020   Procedure: CARPAL TUNNEL RELEASE;  Surgeon: Murrell Drivers, MD;  Location: [facility];  Service: Orthopedics;  Laterality: Right;   CARPAL TUNNEL RELEASE Left 09/27/2020   Procedure: CARPAL TUNNEL RELEASE;  Surgeon: Murrell Drivers, MD;  Location: [facility];  Service: Orthopedics;  Laterality: Left;  Bier block   KNEE SURGERY Bilateral    Left in 2007 and Right in 1981    Current Medications: Active Medications[1]   Allergies:   Lisinopril   Social History   Socioeconomic History   Marital status: Married    Spouse name: Not on file   Number of children: 1   Years of education: Not on file   Highest education level: High school graduate  Occupational History    Comment: [employer]  Tobacco Use   Smoking status: Every Day    Current packs/day: 1.00    Average packs/day: 1 pack/day for 15.0 years (15.0 ttl pk-yrs)    Types: Cigarettes   Smokeless tobacco: Never  Vaping Use   Vaping status: Never Used  Substance and Sexual Activity   Alcohol use: Yes    Alcohol/week: 7.0 - 14.0 standard drinks of alcohol    Types: 7 - 14 Cans of beer per week   Drug use: Not Currently   Sexual activity: Not on file  Other Topics Concern   Not on file  Social History Narrative   Lives with wife   caffeine use heavy   Social Drivers of Health   Tobacco Use: High Risk (09/28/2024)   Patient History    Smoking Tobacco Use: Every Day    Smokeless Tobacco Use: Never    Passive Exposure: Not on file  Financial Resource Strain: Not on file  Food Insecurity: Not on file  Transportation Needs: Not on file  Physical Activity: Not on file  Stress: Not on file  Social Connections: Not on file  Depression (EYV7-0): Not on file  Alcohol Screen: Not on file  Housing: Not on file  Utilities: Not on file  Health Literacy: Not on file     Family History: The patient's family history includes Atrial fibrillation in his father and mother; Breast cancer in his mother; CAD in his father; Heart attack in his father; Hypertension in his father.  ROS:   Please see the history of present illness.    All other systems reviewed and are negative.  EKGs/Labs/Other Studies Reviewed:    The following studies were reviewed today: .SABRAEKG  Interpretation Date/Time:  Monday September 28 2024 08:12:32 EST Ventricular Rate:  72 PR Interval:  154 QRS Duration:  100 QT Interval:  400 QTC Calculation: 438 R Axis:   56  Text Interpretation: Normal sinus rhythm Cannot rule out Inferior infarct , age undetermined Cannot rule out Anterior infarct , age undetermined Abnormal ECG When compared with ECG of 19-Aug-2023 11:32, Minimal criteria for Inferior infarct are now Present Inverted T waves have replaced nonspecific T wave abnormality in Inferior leads Confirmed by Edwyna Backers [phone]) on 09/28/2024 8:25:18 AM     Recent Labs: No results found for requested labs within last 365 days.  Recent Lipid Panel    Component Value Date/Time   CHOL 143 08/14/2021 0828   TRIG 32 08/14/2021 0828   HDL 90 08/14/2021 0828   CHOLHDL 1.6 08/14/2021 0828   LDLCALC 44 08/14/2021  9171    Physical Exam:    VS:  BP 130/80   Pulse 74   Ht 6' 1 (1.854 m)   Wt 201 lb 9.6 oz (91.4 kg)   SpO2 98%   BMI 26.60 kg/m     Wt Readings from Last 3 Encounters:  09/28/24 201 lb 9.6 oz (91.4 kg)  01/13/24 202 lb (91.6 kg)  10/28/23 201 lb (91.2 kg)     GEN: Patient is in no acute distress HEENT: Normal NECK: No JVD; No carotid bruits LYMPHATICS: No lymphadenopathy CARDIAC: Hear sounds regular, 2/6 systolic murmur at the apex. RESPIRATORY:  Clear to auscultation without rales, wheezing or rhonchi  ABDOMEN: Soft, non-tender, non-distended MUSCULOSKELETAL:  No edema; No deformity  SKIN: Warm and dry NEUROLOGIC:  Alert and oriented x 3 PSYCHIATRIC:  Normal affect   Signed, Jennifer JONELLE Crape, MD  09/28/2024 8:26 AM    [facility]     [1]  Current Meds  Medication Sig   amLODipine  (NORVASC ) 10 MG tablet Take 1 tablet (10 mg total) by mouth in the morning.   apixaban  (ELIQUIS ) 5 MG TABS tablet TAKE ONE TABLET BY MOUTH TWICE DAILY   aspirin  EC 81 MG tablet Take 1 tablet (81 mg total) by mouth daily. Swallow whole.    Aspirin -Acetaminophen -Caffeine (GOODYS EXTRA STRENGTH PO) Take 1 packet by mouth as needed (pain.).   diltiazem  (CARDIZEM  CD) 180 MG 24 hr capsule Take 1 capsule (180 mg total) by mouth every morning.   fluorouracil (EFUDEX) 5 % cream Apply 1 Application topically 2 (two) times daily as needed.   indapamide (LOZOL) 2.5 MG tablet Take 2.5 mg by mouth every morning.   metoprolol  succinate (TOPROL -XL) 100 MG 24 hr tablet Take 100 mg by mouth in the morning.   Multiple Vitamin (MULTIVITAMIN WITH MINERALS) TABS tablet Take 1 tablet by mouth in the morning.   Polyethyl Glycol-Propyl Glycol (LUBRICANT EYE DROPS) 0.4-0.3 % SOLN Place 1-2 drops into both eyes 3 (three) times daily as needed for dry eyes (dry/irritated eyes).   psyllium (METAMUCIL) 58.6 % packet Take 1 packet by mouth in the morning.   rosuvastatin  (CRESTOR ) 10 MG tablet Take 10 mg by mouth in the morning.   tadalafil  (CIALIS ) 10 MG tablet Take 10 mg by mouth daily as needed for erectile dysfunction.   [DISCONTINUED] potassium chloride  (KLOR-CON ) 10 MEQ tablet TAKE ONE TABLET BY MOUTH ONCE DAILY   "

## 2024-09-28 NOTE — Patient Instructions (Signed)

## 2024-10-05 ENCOUNTER — Ambulatory Visit: Admitting: Cardiology
